# Patient Record
Sex: Female | Born: 1980 | Race: White | Hispanic: No | Smoking: Never smoker
Health system: Southern US, Community
[De-identification: ages and names within clinical notes are randomized; demographics above are authoritative.]

---

## 2010-02-03 ENCOUNTER — Ambulatory Visit (HOSPITAL_COMMUNITY): Admission: RE | Admit: 2010-02-03 | Discharge: 2010-02-03 | Payer: Self-pay | Admitting: Gynecology

## 2011-07-05 ENCOUNTER — Encounter: Payer: Self-pay | Admitting: *Deleted

## 2011-07-19 IMAGING — RF DG HYSTEROGRAM
6 series · 6 of 6 positions shown · non-contrast
Comparison: none

CLINICAL DATA: Infertility

HYSTEROSALPINGOGRAM
TECHNIQUE: Hysterosalpingogram was performed by the ordering
physician under fluoroscopy.  Fluoroscopic images are submitted for
interpretation following the procedure. Six images are submitted
for interpretation.

[Series 1: run · 1 of 1 slices shown (1 of 6)]
[im 1/1]
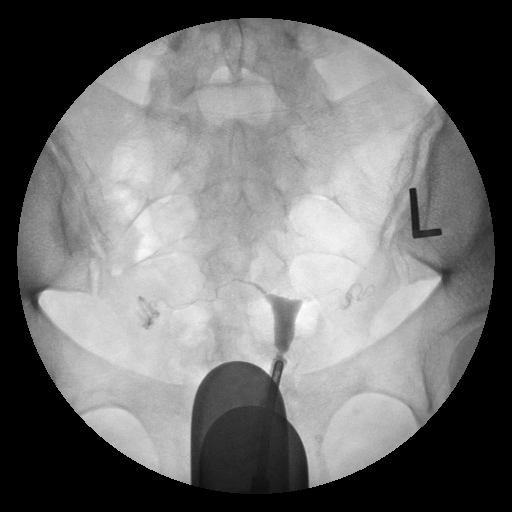

[Series 2: run · 1 of 1 slices shown (2 of 6)]
[im 1/1]
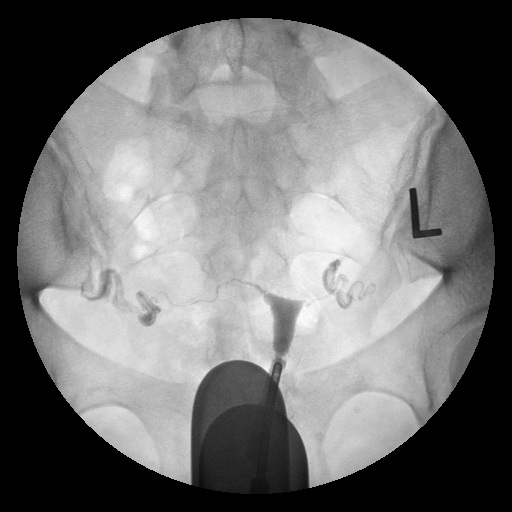

[Series 3: run · 1 of 1 slices shown (3 of 6)]
[im 1/1]
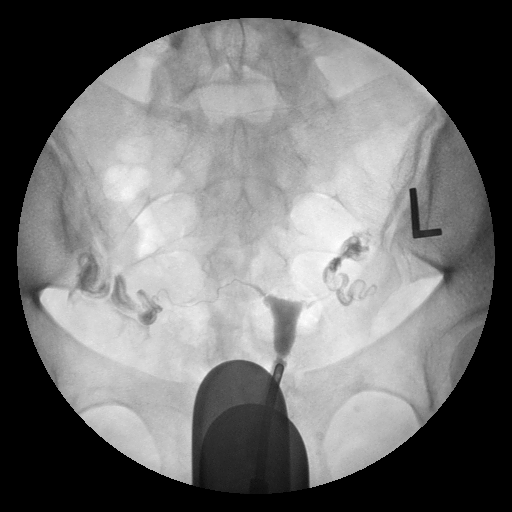

[Series 4: run · 1 of 1 slices shown (4 of 6)]
[im 1/1]
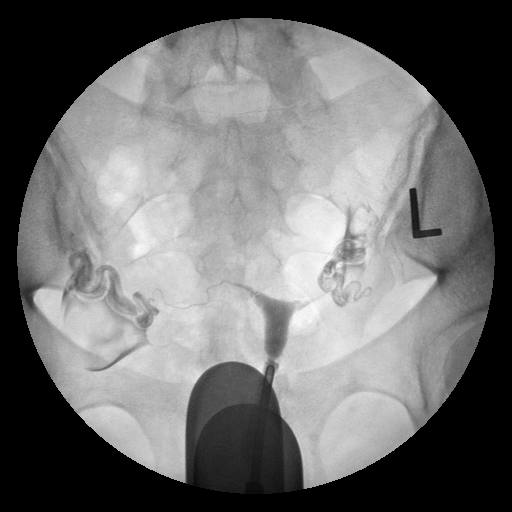

[Series 5: run · 1 of 1 slices shown (5 of 6)]
[im 1/1]
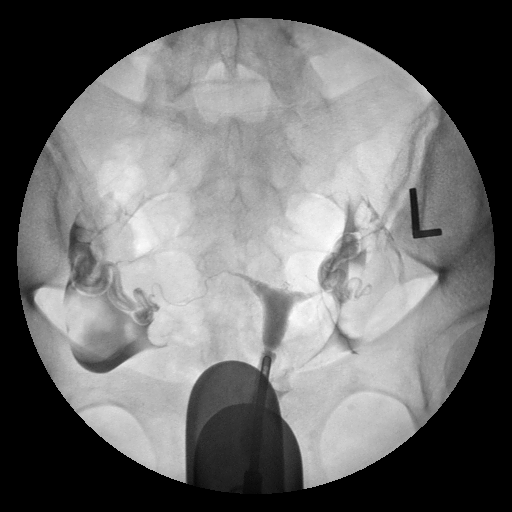

[Series 6: run · 1 of 1 slices shown (6 of 6)]
[im 1/1]
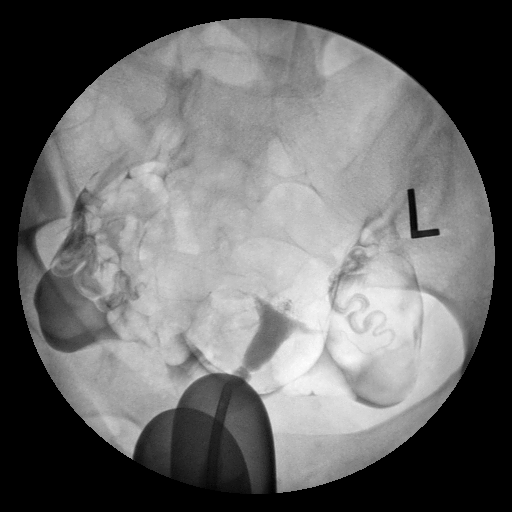

[6 of 6 positions shown; findings below may reference images not displayed]

FINDINGS: There are no filling defects within the uterine cavity.
The uterine contour is normal.  Both fallopian tubes fill and have
a normal appearance.  There is free spill of contrast into both
adnexal regions.
IMPRESSION: Normal HSG.

## 2011-07-21 ENCOUNTER — Other Ambulatory Visit: Payer: Self-pay

## 2011-07-31 ENCOUNTER — Encounter: Payer: Self-pay | Admitting: *Deleted

## 2011-08-07 ENCOUNTER — Other Ambulatory Visit (HOSPITAL_COMMUNITY): Payer: Self-pay | Admitting: *Deleted

## 2011-08-07 DIAGNOSIS — Z0489 Encounter for examination and observation for other specified reasons: Secondary | ICD-10-CM

## 2011-08-07 DIAGNOSIS — IMO0002 Reserved for concepts with insufficient information to code with codable children: Secondary | ICD-10-CM

## 2011-08-07 DIAGNOSIS — Z3682 Encounter for antenatal screening for nuchal translucency: Secondary | ICD-10-CM

## 2011-08-15 ENCOUNTER — Ambulatory Visit (HOSPITAL_COMMUNITY)
Admission: RE | Admit: 2011-08-15 | Discharge: 2011-08-15 | Disposition: A | Discharge status: Home / Self Care | Payer: BC Managed Care – PPO | Source: Ambulatory Visit | Attending: *Deleted | Admitting: *Deleted

## 2011-08-15 ENCOUNTER — Other Ambulatory Visit: Payer: Self-pay | Admitting: *Deleted

## 2011-08-15 ENCOUNTER — Encounter (HOSPITAL_COMMUNITY): Payer: Self-pay

## 2011-08-15 DIAGNOSIS — Z3682 Encounter for antenatal screening for nuchal translucency: Secondary | ICD-10-CM

## 2011-08-15 DIAGNOSIS — Z36 Encounter for antenatal screening of mother: Secondary | ICD-10-CM | POA: Insufficient documentation

## 2011-08-18 LAB — US OB COMP LESS 14 WKS

## 2011-09-26 ENCOUNTER — Other Ambulatory Visit (HOSPITAL_COMMUNITY): Payer: Self-pay

## 2011-09-27 ENCOUNTER — Other Ambulatory Visit (HOSPITAL_COMMUNITY): Payer: Self-pay

## 2014-05-18 ENCOUNTER — Encounter (HOSPITAL_COMMUNITY): Payer: Self-pay

## 2019-10-16 ENCOUNTER — Encounter: Payer: Self-pay | Admitting: Neurology

## 2019-11-21 ENCOUNTER — Encounter: Payer: Self-pay | Admitting: Neurology

## 2019-11-21 ENCOUNTER — Other Ambulatory Visit: Payer: Self-pay

## 2019-11-21 ENCOUNTER — Ambulatory Visit: Payer: BLUE CROSS/BLUE SHIELD | Admitting: Neurology

## 2019-11-21 VITALS — BP 111/74 | HR 76 | Ht 65.0 in | Wt 180.6 lb

## 2019-11-21 DIAGNOSIS — G43109 Migraine with aura, not intractable, without status migrainosus: Secondary | ICD-10-CM | POA: Diagnosis not present

## 2019-11-21 MED ORDER — ONDANSETRON 4 MG PO TBDP: 4.0000 mg | ORAL_TABLET | Freq: Three times a day (TID) | ORAL | 3 refills | Status: DC | PRN

## 2019-11-21 MED ORDER — TOPIRAMATE 25 MG PO TABS: 25.0000 mg | ORAL_TABLET | Freq: Every day | ORAL | 3 refills | Status: DC

## 2019-11-21 NOTE — Progress Notes (Signed)
NEUROLOGY CONSULTATION NOTE  Yvonne Davila MRN: [id number] DOB: 09/07/1980  Referring provider: [name], MD Primary care provider: [name], MD  Reason for consult:  migraines  HISTORY OF PRESENT ILLNESS: Yvonne Davila is a 39 year old right-handed white female who presents for migraines.  History supplemented by referring provider note.  Onset:  Since her 52s Location:  Usually bifrontal/retro-orbital Quality:  throbbing Intensity:  Moderate.  She denies new headache, thunderclap headache  Aura:  Preceded by kaleidoscope vision followed by black out of vision for 5 to 20 minutes Premonitory Phase:  none Postdrome:  Groggy or dull residual ache the next day Associated symptoms:  Nausea, vomiting, photophobia, phonophobia.  She denies associated unilateral numbness or weakness. Duration:  4 to 10 hours Frequency:  1 to 2 a week Frequency of abortive medication: 1 to 2 days a week at most Triggers:  unknown Relieving factors:  none Activity:  aggravates  CT head without contrast performed on 10/10/2019 showed "No acute intracranial findings.  Mild global atrophy which appears advanced for the patient's age."  Recent eye exam was okay.    Current NSAIDS:  Contraindicated (EOE, ulcer) Current analgesics:  Tylenol, Fioricet (not helpful) Current triptans:  none Current ergotamine:  none Current anti-emetic:  none Current muscle relaxants:  none Current anti-anxiolytic:  none Current sleep aide:  none Current Antihypertensive medications:  spironolactone Current Antidepressant medications:  Venlafaxine XR 75mg  Current Anticonvulsant medications:  none Current anti-CGRP:  none Current Vitamins/Herbal/Supplements:  D3 Current Antihistamines/Decongestants:  none Other therapy:  rest Hormone/birth control:  Mirena  Past NSAIDS:  ibuprofen Past analgesics:  Excedrin Past abortive triptans:  Sumatriptan tablet (makes her feel sick) Past  abortive ergotamine:  none Past muscle relaxants:  none Past anti-emetic:  Zofran 4mg  Past antihypertensive medications:  Propranolol (caused drowsiness) Past antidepressant medications:  none Past anticonvulsant medications:  none Past anti-CGRP:  none Past vitamins/Herbal/Supplements:  none Past antihistamines/decongestants:  dramamine Other past therapies:  none  Caffeine:   1 cup coffee in morning Diet:  At least 60 oz water daily.  Sometimes soda.  Occasionally skips meals Exercise:  Not routine Depression:  no; Anxiety:  Some, not significant Other pain:  no Sleep:  6 to 7 hours.  Has OSA but not always able to tolerate CPAP.   Family history of headache:  Twin sister (migraine); mom (migraine)   PAST MEDICAL HISTORY: History reviewed. No pertinent past medical history.  PAST SURGICAL HISTORY: History reviewed. No pertinent surgical history.  MEDICATIONS: Current Outpatient Medications on File Prior to Visit  Medication Sig Dispense Refill  . METFORMIN HCL PO Take by mouth.    PRENATAL VITAMINS PO Take by mouth.     No current facility-administered medications on file prior to visit.    ALLERGIES: Allergies  Allergen Reactions  . Amoxicillin-Pot Clavulanate     FAMILY HISTORY: No family history on file.  SOCIAL HISTORY: Social History   Socioeconomic History  . Marital status: Married    Spouse name: Not on file  . Number of children: Not on file  . Years of education: Not on file  . Highest education level: Not on file  Occupational History  . Not on file  Tobacco Use  . Smoking status: Not on file  Substance and Sexual Activity  . Alcohol use: Not on file  . Drug use: Not on file  . Sexual activity: Not on file  Other Topics Concern  . Not on file  Social  History Narrative  . Not on file   Social Determinants of Health   Financial Resource Strain:   . Difficulty of Paying Living Expenses:   Food Insecurity:   . Worried About Patent examiner in the Last Year:   . Barista in the Last Year:   Transportation Needs:   . Freight forwarder (Medical):   Marland Kitchen Lack of Transportation (Non-Medical):   Physical Activity:   . Days of Exercise per Week:   . Minutes of Exercise per Session:   Stress:   . Feeling of Stress :   Social Connections:   . Frequency of Communication with Friends and Family:   . Frequency of Social Gatherings with Friends and Family:   . Attends Religious Services:   . Active Member of Clubs or Organizations:   . Attends Banker Meetings:   Marland Kitchen Marital Status:   Intimate Partner Violence:   . Fear of Current or Ex-Partner:   . Emotionally Abused:   Marland Kitchen Physically Abused:   . Sexually Abused:     REVIEW OF SYSTEMS: Constitutional: No fevers, chills, or sweats, no generalized fatigue, change in appetite Eyes: No visual changes, double vision, eye pain Ear, nose and throat: No hearing loss, ear pain, nasal congestion, sore throat Cardiovascular: No chest pain, palpitations Respiratory:  No shortness of breath at rest or with exertion, wheezes GastrointestinaI: No nausea, vomiting, diarrhea, abdominal pain, fecal incontinence Genitourinary:  No dysuria, urinary retention or frequency Musculoskeletal:  No neck pain, back pain Integumentary: No rash, pruritus, skin lesions Neurological: as above Psychiatric: No depression, insomnia, anxiety Endocrine: No palpitations, fatigue, diaphoresis, mood swings, change in appetite, change in weight, increased thirst Hematologic/Lymphatic:  No purpura, petechiae. Allergic/Immunologic: no itchy/runny eyes, nasal congestion, recent allergic reactions, rashes  PHYSICAL EXAM: Blood pressure 111/74, pulse 76, height 5\' 5"  (1.651 m), weight 180 lb 9.6 oz (81.9 kg), SpO2 99 %. General: No acute distress.  Patient appears well-groomed.  Head:  Normocephalic/atraumatic Eyes:  fundi examined but not visualized Neck: supple, no paraspinal  tenderness, full range of motion Back: No paraspinal tenderness Heart: regular rate and rhythm Lungs: Clear to auscultation bilaterally. Vascular: No carotid bruits. Neurological Exam: Mental status: alert and oriented to person, place, and time, recent and remote memory intact, fund of knowledge intact, attention and concentration intact, speech fluent and not dysarthric, language intact. Cranial nerves: CN I: not tested CN II: pupils equal, round and reactive to light, visual fields intact CN III, IV, VI:  full range of motion, no nystagmus, no ptosis CN V: facial sensation intact CN VII: upper and lower face symmetric CN VIII: hearing intact CN IX, X: gag intact, uvula midline CN XI: sternocleidomastoid and trapezius muscles intact CN XII: tongue midline Bulk & Tone: normal, no fasciculations. Motor:  5/5 throughout  Sensation:  Pinprick and vibration sensation intact. Deep Tendon Reflexes:  2+ throughout, toes downgoing.  Finger to nose testing:  Without dysmetria.  Heel to shin:  Without dysmetria.  Gait:  Normal station and stride.  Able to turn.  Romberg negative.  IMPRESSION: Migraine with aura, without status migrainosus, not intractable  PLAN: 1.  For preventative management, start topiramate 25mg  at bedtime.  We can increase to 50mg  at bedtime in 4 weeks if needed. 2.  For abortive therapy, will try Tosymra due to her significant associated nausea (better absorption).  Provided samples and if effective will send in prescription.  Zofran ODT 4mg  for nausea PRN 3.  Limit use of pain relievers to no more than 2 days out of week to prevent risk of rebound or medication-overuse headache. 4.  Keep headache diary 5.  Exercise, hydration, caffeine cessation, sleep hygiene, monitor for and avoid triggers 6. Follow up 4 months.   Thank you for allowing me to take part in the care of this patient.  Metta Clines, DO  CC: Cathie Olden, MD

## 2019-11-21 NOTE — Patient Instructions (Signed)
1.  For preventative management, start topiramate 25mg  at bedtime.  If headaches not improved in 4 weeks, contact me and we can increase dose 2.  For rescue therapy, try Tosymra:  1 spray in nostril.  May repeat in one hour if needed (maximum 2 sprays in 24 hours).  If effective, contact me and I will send in a prescription. 3. Ondansetron 4mg  dissolvable tablet for nausea 4.  Limit use of pain relievers to no more than 2 days out of week to prevent risk of rebound or medication-overuse headache. 5.  Keep headache diary 6.  Exercise, hydration, caffeine cessation, sleep hygiene, monitor for and avoid triggers 7.  Follow up 4 months   Migraine Headache A migraine headache is a very strong throbbing pain on one side or both sides of your head. This type of headache can also cause other symptoms. It can last from 4 hours to 3 days. Talk with your doctor about what things may bring on (trigger) this condition. What are the causes? The exact cause of this condition is not known. This condition may be triggered or caused by:  Drinking alcohol.  Smoking.  Taking medicines, such as: ? Medicine used to treat chest pain (nitroglycerin). ? Birth control pills. ? Estrogen. ? Some blood pressure medicines.  Eating or drinking certain products.  Doing physical activity. Other things that may trigger a migraine headache include:  Having a menstrual period.  Pregnancy.  Hunger.  Stress.  Not getting enough sleep or getting too much sleep.  Weather changes.  Tiredness (fatigue). What increases the risk?  Being 6-32 years old.  Being female.  Having a family history of migraine headaches.  Being Caucasian.  Having depression or anxiety.  Being very overweight. What are the signs or symptoms?  A throbbing pain. This pain may: ? Happen in any area of the head, such as on one side or both sides. ? Make it hard to do daily activities. ? Get worse with physical activity. ? Get  worse around bright lights or loud noises.  Other symptoms may include: ? Feeling sick to your stomach (nauseous). ? Vomiting. ? Dizziness. ? Being sensitive to bright lights, loud noises, or smells.  Before you get a migraine headache, you may get warning signs (an aura). An aura may include: ? Seeing flashing lights or having blind spots. ? Seeing bright spots, halos, or zigzag lines. ? Having tunnel vision or blurred vision. ? Having numbness or a tingling feeling. ? Having trouble talking. ? Having weak muscles.  Some people have symptoms after a migraine headache (postdromal phase), such as: ? Tiredness. ? Trouble thinking (concentrating). How is this treated?  Taking medicines that: ? Relieve pain. ? Relieve the feeling of being sick to your stomach. ? Prevent migraine headaches.  Treatment may also include: ? Having acupuncture. ? Avoiding foods that bring on migraine headaches. ? Learning ways to control your body functions (biofeedback). ? Therapy to help you know and deal with negative thoughts (cognitive behavioral therapy). Follow these instructions at home: Medicines  Take over-the-counter and prescription medicines only as told by your doctor.  Ask your doctor if the medicine prescribed to you: ? Requires you to avoid driving or using heavy machinery. ? Can cause trouble pooping (constipation). You may need to take these steps to prevent or treat trouble pooping:  Drink enough fluid to keep your pee (urine) pale yellow.  Take over-the-counter or prescription medicines.  Eat foods that are high in fiber. These  include beans, whole grains, and fresh fruits and vegetables.  Limit foods that are high in fat and sugar. These include fried or sweet foods. Lifestyle  Do not drink alcohol.  Do not use any products that contain nicotine or tobacco, such as cigarettes, e-cigarettes, and chewing tobacco. If you need help quitting, ask your doctor.  Get at least  8 hours of sleep every night.  Limit and deal with stress. General instructions      Keep a journal to find out what may bring on your migraine headaches. For example, write down: ? What you eat and drink. ? How much sleep you get. ? Any change in what you eat or drink. ? Any change in your medicines.  If you have a migraine headache: ? Avoid things that make your symptoms worse, such as bright lights. ? It may help to lie down in a dark, quiet room. ? Do not drive or use heavy machinery. ? Ask your doctor what activities are safe for you.  Keep all follow-up visits as told by your doctor. This is important. Contact a doctor if:  You get a migraine headache that is different or worse than others you have had.  You have more than 15 headache days in one month. Get help right away if:  Your migraine headache gets very bad.  Your migraine headache lasts longer than 72 hours.  You have a fever.  You have a stiff neck.  You have trouble seeing.  Your muscles feel weak or like you cannot control them.  You start to lose your balance a lot.  You start to have trouble walking.  You pass out (faint).  You have a seizure. Summary  A migraine headache is a very strong throbbing pain on one side or both sides of your head. These headaches can also cause other symptoms.  This condition may be treated with medicines and changes to your lifestyle.  Keep a journal to find out what may bring on your migraine headaches.  Contact a doctor if you get a migraine headache that is different or worse than others you have had.  Contact your doctor if you have more than 15 headache days in a month. This information is not intended to replace advice given to you by your health care provider. Make sure you discuss any questions you have with your health care provider. Document Revised: 10/25/2018 Document Reviewed: 08/15/2018 Elsevier Patient Education  2020 [employer].

## 2019-12-23 ENCOUNTER — Telehealth: Payer: Self-pay | Admitting: Neurology

## 2019-12-23 NOTE — Telephone Encounter (Signed)
[facility] message:  "Caller states she is requesting the dosage for her migraine meds be increased"

## 2019-12-23 NOTE — Telephone Encounter (Signed)
LMOVM 8:34am: What medication are we asking for. Topirmate or [employer]

## 2019-12-24 NOTE — Telephone Encounter (Signed)
Pt returned our call, She was told at her last visit if she needed to we would increase the dosage of Topiramate to 50mg .  Advised pt that I do see that in her note and let Dr.Jaffe know she feels that she needs the increase.

## 2019-12-25 ENCOUNTER — Other Ambulatory Visit: Payer: Self-pay

## 2019-12-25 MED ORDER — TOPIRAMATE 50 MG PO TABS: 50.0000 mg | ORAL_TABLET | Freq: Every day | ORAL | 0 refills | Status: DC

## 2019-12-25 NOTE — Telephone Encounter (Signed)
Topiramate 50 mg at bedtime sent to Pt pharmacy. Pt can call back in 4 weeks if she desires to increase.

## 2019-12-25 NOTE — Telephone Encounter (Signed)
Ok to increase topiramate to 50mg  at bedtime.

## 2020-01-22 ENCOUNTER — Other Ambulatory Visit: Payer: Self-pay | Admitting: Neurology

## 2020-04-01 NOTE — Progress Notes (Signed)
NEUROLOGY FOLLOW UP OFFICE NOTE  NARIYA Davila [id number]  HISTORY OF PRESENT ILLNESS: Yvonne Davila. Kaatz is a 39 year old right-handed white female who presents for migraines.  History supplemented by referring provider note.  UPDATE: Started topiramate in May, which was subsequently increased to 50mg  in June.  Intensity:  moderate Duration:  2 to 4 hours Frequency:  15 headache days in last 30 days, however none of them were "migraine-level" Frequency of abortive medication: takes either Tylenol or Motrin 15 days a month. Current NSAIDS:  Contraindicated but may need to take Motrin (EOE, ulcer) Current analgesics:  Tylenol Current triptans:  Tosymra NS (tried one sample but didn't think she took it quick enough). Current ergotamine:  none Current anti-emetic:  Zofran ODT Current muscle relaxants:  none Current anti-anxiolytic:  none Current sleep aide:  none Current Antihypertensive medications:  spironolactone Current Antidepressant medications:  Venlafaxine XR 75mg  Current Anticonvulsant medications:  topiramate 50mg  Current anti-CGRP:  none Current Vitamins/Herbal/Supplements:  D3 Current Antihistamines/Decongestants:  none Other therapy:  rest Hormone/birth control:  Mirena  Caffeine:   1 cup coffee in morning Diet:  At least 60 oz water daily.  Stopped soda.  Occasionally skips meals Exercise:  Not routine Depression:  no; Anxiety:  Some, not significant Other pain:  no Sleep:  6 to 7 hours.  Has OSA but not always able to tolerate CPAP.   HISTORY: Onset:  Since her 85s Location:  Usually bifrontal/retro-orbital Quality:  throbbing.  Less severe headaches are pressure-like Initial Intensity:  Moderate.  She denies new headache, thunderclap headache  Aura:  Preceded by kaleidoscope vision followed by black out of vision for 5 to 20 minutes Premonitory Phase:  none Postdrome:  Groggy or dull residual ache the next day Associated symptoms:  Nausea, vomiting,  photophobia, phonophobia.  She denies associated unilateral numbness or weakness. Initial Duration:  4 to 10 hours Initial Frequency:  1 to 2 a week Initial Frequency of abortive medication: 1 to 2 days a week at most Triggers:  unknown Relieving factors:  none Activity:  aggravates  CT head without contrast performed on 10/10/2019 showed "No acute intracranial findings.  Mild global atrophy which appears advanced for the patient's age."  Eye exam was okay.    Past NSAIDS:  ibuprofen Past analgesics:  Excedrin, Tylenol, Fioricet with Codeine Past abortive triptans:  Sumatriptan tablet (makes her feel sick) Past abortive ergotamine:  none Past muscle relaxants:  none Past anti-emetic:  Zofran 4mg  Past antihypertensive medications:  Propranolol (caused drowsiness) Past antidepressant medications:  none Past anticonvulsant medications:  none Past anti-CGRP:  none Past vitamins/Herbal/Supplements:  none Past antihistamines/decongestants:  dramamine Other past therapies:  none    Family history of headache:  Twin sister (migraine); mom (migraine)  PAST MEDICAL HISTORY: No past medical history on file.  MEDICATIONS: Current Outpatient Medications on File Prior to Visit  Medication Sig Dispense Refill  . acyclovir (ZOVIRAX) 400 MG tablet Take 400 mg by mouth 2 (two) times daily.    . butalbital-acetaminophen-caffeine (FIORICET WITH CODEINE) 50-325-40-30 MG capsule Take 1 capsule by mouth every 6 (six) hours as needed for headache.    . METFORMIN HCL PO Take by mouth.    . ondansetron (ZOFRAN ODT) 4 MG disintegrating tablet Take 1 tablet (4 mg total) by mouth every 8 (eight) hours as needed for nausea or vomiting. 20 tablet 3  . pantoprazole (PROTONIX) 40 MG tablet Take 40 mg by mouth daily.    PRENATAL VITAMINS PO  Take by mouth.    . spironolactone (ALDACTONE) 50 MG tablet Take 50 mg by mouth daily.    Marland Kitchen topiramate (TOPAMAX) 50 MG tablet TAKE 1 TABLET(50 MG) BY MOUTH AT BEDTIME  30 tablet 2  . venlafaxine XR (EFFEXOR-XR) 75 MG 24 hr capsule Take 75 mg by mouth 1 day or 1 dose.     No current facility-administered medications on file prior to visit.    ALLERGIES: Allergies  Allergen Reactions  . Amoxicillin-Pot Clavulanate     FAMILY HISTORY: History reviewed. No pertinent family history.  SOCIAL HISTORY: Social History   Socioeconomic History  . Marital status: Married    Spouse name: Not on file  . Number of children: Not on file  . Years of education: Not on file  . Highest education level: Not on file  Occupational History  . Not on file  Tobacco Use  . Smoking status: Never Smoker  . Smokeless tobacco: Never Used  Substance and Sexual Activity  . Alcohol use: Yes    Comment: Occassional  . Drug use: Not on file  . Sexual activity: Not on file  Other Topics Concern  . Not on file  Social History Narrative   Right handed   Lives husband and daughter two story   Social Determinants of Health   Financial Resource Strain:   . Difficulty of Paying Living Expenses: Not on file  Food Insecurity:   . Worried About Programme researcher, broadcasting/film/video in the Last Year: Not on file  . Ran Out of Food in the Last Year: Not on file  Transportation Needs:   . Lack of Transportation (Medical): Not on file  . Lack of Transportation (Non-Medical): Not on file  Physical Activity:   . Days of Exercise per Week: Not on file  . Minutes of Exercise per Session: Not on file  Stress:   . Feeling of Stress : Not on file  Social Connections:   . Frequency of Communication with Friends and Family: Not on file  . Frequency of Social Gatherings with Friends and Family: Not on file  . Attends Religious Services: Not on file  . Active Member of Clubs or Organizations: Not on file  . Attends Banker Meetings: Not on file  . Marital Status: Not on file  Intimate Partner Violence:   . Fear of Current or Ex-Partner: Not on file  . Emotionally Abused: Not on file   . Physically Abused: Not on file  . Sexually Abused: Not on file    PHYSICAL EXAM: Blood pressure (!) 126/91, pulse 83, height 5\' 5"  (1.651 m), weight 181 lb 6.4 oz (82.3 kg), SpO2 100 %. General: No acute distress.  Patient appears well-groomed.   Head:  Normocephalic/atraumatic Eyes:  Fundi examined but not visualized Neck: supple, no paraspinal tenderness, full range of motion Heart:  Regular rate and rhythm Lungs:  Clear to auscultation bilaterally Back: No paraspinal tenderness Neurological Exam: alert and oriented to person, place, and time. Attention span and concentration intact, recent and remote memory intact, fund of knowledge intact.  Speech fluent and not dysarthric, language intact.  CN II-XII intact. Bulk and tone normal, muscle strength 5/5 throughout.  Sensation to light touch, temperature and vibration intact.  Deep tendon reflexes 2+ throughout, toes downgoing.  Finger to nose and heel to shin testing intact.  Gait normal, Romberg negative.  IMPRESSION: Migraine with aura, without status migrainosus, not intractable  PLAN: 1.  For preventative management, will increase topiramate  to 100mg  at bedtime. If headaches not improved in 6 weeks, we will either add or switch to Aimovig. 2.  For abortive therapy, she will again try Tosymra, taking at earliest onset and repeating dose in at least one hour if needed. 3.  Limit use of pain relievers to no more than 2 days out of week to prevent risk of rebound or medication-overuse headache. 4.  Keep headache diary 5.  Exercise, hydration, caffeine cessation, sleep hygiene, monitor for and avoid triggers 6. Follow up 6 months.   , DO  CC: Shon Millet, MD

## 2020-04-05 ENCOUNTER — Ambulatory Visit (INDEPENDENT_AMBULATORY_CARE_PROVIDER_SITE_OTHER): Payer: BC Managed Care – PPO | Admitting: Neurology

## 2020-04-05 ENCOUNTER — Other Ambulatory Visit: Payer: Self-pay

## 2020-04-05 ENCOUNTER — Encounter: Payer: Self-pay | Admitting: Neurology

## 2020-04-05 VITALS — BP 126/91 | HR 83 | Ht 65.0 in | Wt 181.4 lb

## 2020-04-05 DIAGNOSIS — G43109 Migraine with aura, not intractable, without status migrainosus: Secondary | ICD-10-CM | POA: Diagnosis not present

## 2020-04-05 MED ORDER — TOPIRAMATE 100 MG PO TABS: 100.0000 mg | ORAL_TABLET | Freq: Every day | ORAL | 5 refills | Status: DC

## 2020-04-05 NOTE — Patient Instructions (Addendum)
°  1. Increase topiramate to 100mg  at bedtime.  Contact in 6 weeks with update 2. Take Tosymra NS at earliest onset of headache.  May repeat dose once in 1 hour if needed.  Maximum 2 sprays in 24 hours.  IF effective, contact me for prescription. 3. Limit use of pain relievers to no more than 2 days out of the week.  These medications include acetaminophen, NSAIDs (ibuprofen/Advil/Motrin, naproxen/Aleve, triptans (Imitrex/sumatriptan), Excedrin, and narcotics.  This will help reduce risk of rebound headaches. 4. Be aware of common food triggers:  - Caffeine:  coffee, black tea, cola, Mt. Dew  - Chocolate  - Dairy:  aged cheeses (brie, blue, cheddar, gouda, [place], provolone, [place], Swiss, etc), chocolate milk, buttermilk, sour cream, limit eggs and yogurt  - Nuts, peanut butter  - Alcohol  - Cereals/grains:  FRESH breads (fresh bagels, sourdough, doughnuts), yeast productions  - Processed/canned/aged/cured meats (pre-packaged deli meats, hotdogs)  - MSG/glutamate:  soy sauce, flavor enhancer, pickled/preserved/marinated foods  - Sweeteners:  aspartame (Equal, Nutrasweet).  Sugar and Splenda are okay  - Vegetables:  legumes (lima beans, lentils, snow peas, fava beans, pinto peans, peas, garbanzo beans), sauerkraut, onions, olives, pickles  - Fruit:  avocados, bananas, citrus fruit (orange, lemon, grapefruit), mango  - Other:  Frozen meals, macaroni and cheese 5. Routine exercise 6. Stay adequately hydrated (aim for 64 oz water daily) 7. Keep headache diary 8. Maintain proper stress management 9. Maintain proper sleep hygiene 10. Do not skip meals 11. Consider supplements:  magnesium citrate 400mg  daily, riboflavin 400mg  daily, coenzyme Q10 100mg  three times daily.

## 2020-04-13 ENCOUNTER — Other Ambulatory Visit: Payer: Self-pay | Admitting: Neurology

## 2020-04-13 MED ORDER — AIMOVIG 70 MG/ML ~~LOC~~ SOAJ: 70.0000 mg | SUBCUTANEOUS | 5 refills | Status: DC

## 2020-04-13 NOTE — Telephone Encounter (Signed)
Options include:  Giving the topiramate another week to see if her body adapts to the increased dose.  Otherwise, we can taper off of topiramate and start an alternative medication.  We can try Aimovig, a monthly self injection (auto-injector) that has been very effective in migraine prevention and typically well-tolerated (like all injections, monitor for injection site reaction).  Alternatively, she can try Nurtec a dissolvable tablet that is taken once every other day (in the same family as the injection).

## 2020-04-14 ENCOUNTER — Telehealth: Payer: Self-pay | Admitting: Neurology

## 2020-04-14 ENCOUNTER — Encounter: Payer: Self-pay | Admitting: Neurology

## 2020-04-14 NOTE — Telephone Encounter (Signed)
Can you check on the status of this, thanks

## 2020-04-14 NOTE — Telephone Encounter (Signed)
Patient called and said her insurance company is requiring a prior authorization for Aimovig 70 MG.  [facility] [address] in [place], [place]

## 2020-04-14 NOTE — Progress Notes (Addendum)
Harless Litten (Key: [id number]) Aimovig 70MG /ML auto-injectors   Form PA Form [phone] NCPDP) Created 7 minutes ago Sent to Plan 5 minutes ago Plan Response 5 minutes ago Submit Clinical Questions 1 minute ago Determination Favorable 1 minute ago Message from Plan PA Case: ([id number], Status: Approved, Coverage Starts on: 04/14/2020 12:00:00 AM, Coverage Ends on: 07/13/2020 12:00:00 AM.

## 2020-04-14 NOTE — Telephone Encounter (Signed)
Approval on file for the aimovig valid until 07/13/20. Sent to her pharm so they could re-run medication.

## 2020-07-02 ENCOUNTER — Encounter: Payer: Self-pay | Admitting: Neurology

## 2020-07-02 NOTE — Progress Notes (Signed)
Yvonne Davila (Key: [id number]) Aimovig 70MG /ML auto-injectors   Form PA Form [id number] NCPDP) Created 17 hours ago Sent to Plan 17 hours ago Plan Response 17 hours ago Submit Clinical Questions 18 minutes ago Determination Favorable 17 minutes ago Message from Plan PA Case: ([id number], Status: Approved, Coverage Starts on: 07/02/2020 12:00:00 AM, Coverage Ends on: 07/02/2021 12:00:00 AM.

## 2020-07-14 ENCOUNTER — Telehealth: Payer: Self-pay

## 2020-07-14 NOTE — Telephone Encounter (Signed)
Pt called and informed that Results should first be discussed by the ordering provider.  She should make appointment with me for further review (bring CD and report) pt has an appointment in April we will pt her on wait list if something open up sooner

## 2020-07-14 NOTE — Telephone Encounter (Signed)
Results should first be discussed by the ordering provider.  She should make appointment with me for further review (bring CD and report)

## 2020-07-18 NOTE — Progress Notes (Signed)
NEUROLOGY FOLLOW UP OFFICE NOTE  Yvonne Davila [id number]   Subjective:  Yvonne Davila is a 39 year oldright-handed white female who follows up for migraines.  She is accompanied by her husband.  UPDATE: Increased topiramate to 100mg  at bedtime but it caused increased fatigue and depression.  She was switched to Aimovig 70mg  in September.  Migraines have been better.   Intensity:  moderate Duration:  1 to 2 hours Frequency:  1 headache in December Usually doesn't take analgesics.  Usually needs to eat and they get better. No severe migraines in December  As a follow up of the CT from March, she had an MRI of the brain on 07/05/2020, which was personally reviewed and again demonstrated mild generalized atrophy.  This finding has been concerning for the patient and is wondering what should be done.  She is concerned about Alzheimer's because her maternal grandfather was diagnosed with AD in his 84s and her maternal aunt was diagnosed in her 43s.  Frequency of abortive medication: takes either Tylenol or Motrin 15 days a month. Current NSAIDS:Contraindicated but may need to take Motrin (EOE, ulcer) Current analgesics:Tylenol Current triptans:Tosymra NS  Current ergotamine:none Current anti-emetic:Zofran ODT Current muscle relaxants:none Current anti-anxiolytic:none Current sleep aide:none Current Antihypertensive medications:spironolactone Current Antidepressant medications:Venlafaxine XR 75mg  Current Anticonvulsant medications:none Current anti-CGRP:Aimovig 70mg  Current Vitamins/Herbal/Supplements:D3 Current Antihistamines/Decongestants:none Other therapy:rest Hormone/birth control:Mirena  Caffeine:1 cup coffee in morning Diet:At least 60 oz water daily. Stopped soda. Occasionally skips meals Exercise:Not routine Depression:no; Anxiety:Some, not significant Other pain:no Sleep: 6 to 7 hours. Has OSA but not always able to  tolerate CPAP.   HISTORY: Onset:Since her 21s Location:Usually bifrontal/retro-orbital Quality:throbbing.  Less severe headaches are pressure-like Initial Intensity:Moderate.Shedenies new headache, thunderclap headache  Aura:Preceded by kaleidoscope vision followed by black out of vision for5 to 71s Premonitory Phase:none Postdrome:Groggy or dull residual ache the next day Associated symptoms:Nausea, vomiting, photophobia, phonophobia.Shedenies associated unilateral numbness or weakness. Initial Duration:4 to 10 hours Initial Frequency:1 to 2 a week Initial Frequency of abortive medication:1 to 2 days a week at most Triggers:unknown Relieving factors:none Activity:aggravates  CT head without contrast performed on 10/10/2019 showed "No acute intracranial findings. Mild global atrophy which appears advanced for the patient's age."  Eye exam was okay.  Past NSAIDS:ibuprofen Past analgesics:Excedrin, Tylenol, Fioricet with Codeine Past abortive triptans:Sumatriptantablet(makes her feel sick) Past abortive ergotamine:none Past muscle relaxants:none Past anti-emetic:Zofran 4mg  Past antihypertensive medications:Propranolol (caused drowsiness) Past antidepressant medications:none Past anticonvulsant medications:topiramate 100mg  (depression, fatigue) Past anti-CGRP:none Past vitamins/Herbal/Supplements:none Past antihistamines/decongestants:dramamine Other past therapies:none   Family history of headache:Twin sister (migraine); mom (migraine)  PAST MEDICAL HISTORY: No past medical history on file.  MEDICATIONS: Current Outpatient Medications on File Prior to Visit  Medication Sig Dispense Refill  . acyclovir (ZOVIRAX) 400 MG tablet Take 400 mg by mouth 2 (two) times daily.    . butalbital-acetaminophen-caffeine (FIORICET WITH CODEINE) 50-325-40-30 MG capsule Take 1 capsule by mouth every 6 (six) hours  as needed for headache. (Patient not taking: Reported on 04/05/2020)    . Erenumab-aooe (AIMOVIG) 70 MG/ML SOAJ Inject 70 mg into the skin every 28 (twenty-eight) days. 1.12 mL 5  . METFORMIN HCL PO Take by mouth. (Patient not taking: Reported on 04/05/2020)    . ondansetron (ZOFRAN ODT) 4 MG disintegrating tablet Take 1 tablet (4 mg total) by mouth every 8 (eight) hours as needed for nausea or vomiting. 20 tablet 3  . pantoprazole (PROTONIX) 40 MG tablet Take 40 mg by mouth daily.     PRENATAL VITAMINS PO Take by mouth. (Patient not taking: Reported on 04/05/2020)    . spironolactone (ALDACTONE) 50 MG tablet Take 50 mg by mouth daily.    Marland Kitchen venlafaxine XR (EFFEXOR-XR) 75 MG 24 hr capsule Take 75 mg by mouth 1 day or 1 dose.     No current facility-administered medications on file prior to visit.    ALLERGIES: Allergies  Allergen Reactions  . Amoxicillin-Pot Clavulanate     FAMILY HISTORY: History reviewed. No pertinent family history.  SOCIAL HISTORY: Social History   Socioeconomic History  . Marital status: Married    Spouse name: Not on file  . Number of children: Not on file  . Years of education: Not on file  . Highest education level: Not on file  Occupational History  . Not on file  Tobacco Use  . Smoking status: Never Smoker  . Smokeless tobacco: Never Used  Substance and Sexual Activity  . Alcohol use: Yes    Comment: Occassional  . Drug use: Not on file  . Sexual activity: Not on file  Other Topics Concern  . Not on file  Social History Narrative   Right handed   Lives husband and daughter two story   Social Determinants of Health   Financial Resource Strain: Not on file  Food Insecurity: Not on file  Transportation Needs: Not on file  Physical Activity: Not on file  Stress: Not on file  Social Connections: Not on file  Intimate Partner Violence: Not on file     Objective:  Blood pressure 108/73, pulse 88, height 5\' 5"  (1.651 m), weight 189 lb 3.2 oz  (85.8 kg), SpO2 99 %. General: No acute distress.  Patient appears well-groomed.     Assessment/Plan:   1.  Migraine with aura, without status migrainosus, not intractable, improved 2.  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  It is mild.  1.  Migraine prevention:  Aimovig 70mg  every 28 days 2.  Migraine rescue:  Tosymra or OTC analgesic 3.  Limit use of pain relievers to no more than 2 days out of week to prevent risk of rebound or medication-overuse headache. 4.  Keep headache diary 5.  For overall brain health, recommended routine exercise, Mediterranean diet and proper sleep hygiene. 6.  Follow up 6 months.  , DO  CC: , MD

## 2020-07-19 ENCOUNTER — Encounter: Payer: Self-pay | Admitting: Neurology

## 2020-07-19 ENCOUNTER — Other Ambulatory Visit: Payer: Self-pay

## 2020-07-19 ENCOUNTER — Ambulatory Visit: Payer: BC Managed Care – PPO | Admitting: Neurology

## 2020-07-19 VITALS — BP 108/73 | HR 88 | Ht 65.0 in | Wt 189.2 lb

## 2020-07-19 DIAGNOSIS — G43109 Migraine with aura, not intractable, without status migrainosus: Secondary | ICD-10-CM | POA: Diagnosis not present

## 2020-07-19 DIAGNOSIS — G319 Degenerative disease of nervous system, unspecified: Secondary | ICD-10-CM

## 2020-07-19 NOTE — Patient Instructions (Signed)
I think the mild atrophy of your brain is just how your brain looks.  I don't think it is indicative of anything clinical at this point.  1.  Recommend routine exercise, proper sleep and Mediterranean diet (see below) 2.  Continue Aimovig 3.  Follow up in 6 months   Mediterranean Diet A Mediterranean diet refers to food and lifestyle choices that are based on the traditions of countries located on the [employer]. This way of eating has been shown to help prevent certain conditions and improve outcomes for people who have chronic diseases, like kidney disease and heart disease. What are tips for following this plan? Lifestyle  Cook and eat meals together with your family, when possible.  Drink enough fluid to keep your urine clear or pale yellow.  Be physically active every day. This includes: ? Aerobic exercise like running or swimming. ? Leisure activities like gardening, walking, or housework.  Get 7-8 hours of sleep each night.  If recommended by your health care provider, drink red wine in moderation. This means 1 glass a day for nonpregnant women and 2 glasses a day for men. A glass of wine equals 5 oz (150 mL). Reading food labels   Check the serving size of packaged foods. For foods such as rice and pasta, the serving size refers to the amount of cooked product, not dry.  Check the total fat in packaged foods. Avoid foods that have saturated fat or trans fats.  Check the ingredients list for added sugars, such as corn syrup. Shopping  At the grocery store, buy most of your food from the areas near the walls of the store. This includes: ? Fresh fruits and vegetables (produce). ? Grains, beans, nuts, and seeds. Some of these may be available in unpackaged forms or large amounts (in bulk). ? Fresh seafood. ? Poultry and eggs. ? Low-fat dairy products.  Buy whole ingredients instead of prepackaged foods.  Buy fresh fruits and vegetables in-season from local farmers  markets.  Buy frozen fruits and vegetables in resealable bags.  If you do not have access to quality fresh seafood, buy precooked frozen shrimp or canned fish, such as tuna, salmon, or sardines.  Buy small amounts of raw or cooked vegetables, salads, or olives from the deli or salad bar at your store.  Stock your pantry so you always have certain foods on hand, such as olive oil, canned tuna, canned tomatoes, rice, pasta, and beans. Cooking  Cook foods with extra-virgin olive oil instead of using butter or other vegetable oils.  Have meat as a side dish, and have vegetables or grains as your main dish. This means having meat in small portions or adding small amounts of meat to foods like pasta or stew.  Use beans or vegetables instead of meat in common dishes like chili or lasagna.  Experiment with different cooking methods. Try roasting or broiling vegetables instead of steaming or sauteing them.  Add frozen vegetables to soups, stews, pasta, or rice.  Add nuts or seeds for added healthy fat at each meal. You can add these to yogurt, salads, or vegetable dishes.  Marinate fish or vegetables using olive oil, lemon juice, garlic, and fresh herbs. Meal planning   Plan to eat 1 vegetarian meal one day each week. Try to work up to 2 vegetarian meals, if possible.  Eat seafood 2 or more times a week.  Have healthy snacks readily available, such as: ? Vegetable sticks with hummus. ? [place] yogurt. ?  Fruit and nut trail mix.  Eat balanced meals throughout the week. This includes: ? Fruit: 2-3 servings a day ? Vegetables: 4-5 servings a day ? Low-fat dairy: 2 servings a day ? Fish, poultry, or lean meat: 1 serving a day ? Beans and legumes: 2 or more servings a week ? Nuts and seeds: 1-2 servings a day ? Whole grains: 6-8 servings a day ? Extra-virgin olive oil: 3-4 servings a day  Limit red meat and sweets to only a few servings a month What are my food  choices?  Mediterranean diet ? Recommended  Grains: Whole-grain pasta. Brown rice. [place] wheat. Polenta. Couscous. Whole-wheat bread. [place].  Vegetables: Artichokes. Beets. Broccoli. Cabbage. Carrots. Eggplant. Green beans. Chard. Kale. Spinach. Onions. Leeks. Peas. Squash. Tomatoes. Peppers. Radishes.  Fruits: Apples. Apricots. Avocado. Berries. Bananas. Cherries. Dates. Figs. Grapes. Lemons. Melon. Oranges. Peaches. Plums. Pomegranate.  Meats and other protein foods: Beans. Almonds. Sunflower seeds. Pine nuts. Peanuts. [place]. Salmon. Scallops. Shrimp. [place]. Tilapia. Clams. Oysters. Eggs.  Dairy: Low-fat milk. Cheese. Greek yogurt.  Beverages: Water. Red wine. Herbal tea.  Fats and oils: Extra virgin olive oil. Avocado oil. Grape seed oil.  Sweets and desserts: [place] yogurt with honey. Baked apples. Poached pears. Trail mix.  Seasoning and other foods: Basil. Cilantro. Coriander. Cumin. Mint. Parsley. Sage. Rosemary. Tarragon. Garlic. Oregano. Thyme. Pepper. Balsalmic vinegar. Tahini. Hummus. Tomato sauce. Olives. Mushrooms. ? Limit these  Grains: Prepackaged pasta or rice dishes. Prepackaged cereal with added sugar.  Vegetables: Deep fried potatoes (french fries).  Fruits: Fruit canned in syrup.  Meats and other protein foods: Beef. Pork. Lamb. Poultry with skin. Hot dogs. [place] Salines.  Dairy: Ice cream. Sour cream. Whole milk.  Beverages: Juice. Sugar-sweetened soft drinks. Beer. Liquor and spirits.  Fats and oils: Butter. Canola oil. Vegetable oil. Beef fat (tallow). Lard.  Sweets and desserts: Cookies. Cakes. Pies. Candy.  Seasoning and other foods: Mayonnaise. [place] sauces and marinades. The items listed may not be a complete list. Talk with your dietitian about what dietary choices are right for you. Summary  The Mediterranean diet includes both food and lifestyle choices.  Eat a variety of fresh fruits and vegetables, beans, nuts, seeds, and whole  grains.  Limit the amount of red meat and sweets that you eat.  Talk with your health care provider about whether it is safe for you to drink red wine in moderation. This means 1 glass a day for nonpregnant women and 2 glasses a day for men. A glass of wine equals 5 oz (150 mL). This information is not intended to replace advice given to you by your health care provider. Make sure you discuss any questions you have with your health care provider. Document Revised: 03/02/2016 Document Reviewed: 02/24/2016 Elsevier Patient Education  [place].

## 2020-09-25 ENCOUNTER — Other Ambulatory Visit: Payer: Self-pay | Admitting: Neurology

## 2020-10-15 ENCOUNTER — Ambulatory Visit: Payer: BC Managed Care – PPO | Admitting: Neurology

## 2021-01-18 ENCOUNTER — Other Ambulatory Visit: Payer: Self-pay | Admitting: Neurology

## 2021-01-28 NOTE — Progress Notes (Signed)
NEUROLOGY FOLLOW UP OFFICE NOTE  Yvonne Davila [id number]  Assessment/Plan:   Migraine with aura, without status migrainosus, not intractable - worse likely due to stress  Migraine prevention:  Increase Aimovig to 140mg  Q28d Migraine rescue:  Will have her try Trudhesa nasal spray Limit use of pain relievers to no more than 2 days out of week to prevent risk of rebound or medication-overuse headache. Keep headache diary Follow up 6 months   Subjective:  . Kuznia is a 40 year old right-handed white female who follows up for migraines.  She is accompanied by her husband.   UPDATE: She is currently going through a divorce and has had increased stress.  She has had worsening migraines. Intensity:  moderate to severe Duration:  2 to 3 days Frequency:  2 severe migraines a month (total 8 to 10 headache days a month)  Current NSAIDS:  Ibuprofen.  Contraindicated but may need to take Motrin (EOE, ulcer) Current analgesics:  Tylenol Current triptans:  none Current ergotamine:  none Current anti-emetic:  Zofran ODT Current muscle relaxants:  none Current anti-anxiolytic:  none Current sleep aide:  none Current Antihypertensive medications:  spironolactone Current Antidepressant medications:  Venlafaxine XR 75mg  Current Anticonvulsant medications:  none Current anti-CGRP:  Aimovig 70mg  Current Vitamins/Herbal/Supplements:  D3 Current Antihistamines/Decongestants:  none Other therapy:  rest Hormone/birth control:  Mirena   Caffeine:   1 cup coffee in morning Diet:  At least 60 oz water daily.  Stopped soda.  Occasionally skips meals Exercise:  Not routine Depression:  no; Anxiety:  Some, not significant Other pain:  no Sleep:  6 to 7 hours.  Has OSA but not always able to tolerate CPAP.    HISTORY:  Onset:  Since her 56s Location:  Usually bifrontal/retro-orbital Quality:  throbbing.  Less severe headaches are pressure-like Initial Intensity:  Moderate.  She denies  new headache, thunderclap headache Aura:  Preceded by kaleidoscope vision followed by black out of vision for 5 to 20 minutes Premonitory Phase:  none Postdrome:  Groggy or dull residual ache the next day Associated symptoms:  Nausea, vomiting, photophobia, phonophobia.  She denies associated unilateral numbness or weakness. Initial Duration:  4 to 10 hours Initial Frequency:  1 to 2 a week Initial Frequency of abortive medication: 1 to 2 days a week at most Triggers:  unknown Relieving factors:  none Activity:  aggravates   CT head without contrast performed on 10/10/2019 showed "No acute intracranial findings.  Mild global atrophy which appears advanced for the patient's age."  As a follow up of the CT from March, she had an MRI of the brain on 07/05/2020, which was personally reviewed and again demonstrated mild generalized atrophy. This finding has been concerning for the patient and is wondering what should be done.  She is concerned about Alzheimer's because her maternal grandfather was diagnosed with AD in his 56s and her maternal aunt was diagnosed in her 95s.   Eye exam was okay.     Past NSAIDS:  ibuprofen Past analgesics:  Excedrin, Tylenol, Fioricet with Codeine Past abortive triptans:  Sumatriptan tablet (makes her feel sick), Tosymra NS Past abortive ergotamine:  none Past muscle relaxants:  none Past anti-emetic:  Zofran 4mg  Past antihypertensive medications:  Propranolol (caused drowsiness) Past antidepressant medications:  none Past anticonvulsant medications:  topiramate 100mg  (depression, fatigue) Past anti-CGRP:  07/07/2020 Past vitamins/Herbal/Supplements:  none Past antihistamines/decongestants:  dramamine Other past therapies:  none     Family history of headache:  Twin sister (migraine); mom (migraine)  PAST MEDICAL HISTORY: No past medical history on file.  MEDICATIONS: Current Outpatient Medications on File Prior to Visit  Medication Sig Dispense Refill    acyclovir (ZOVIRAX) 400 MG tablet Take 400 mg by mouth 2 (two) times daily.     AIMOVIG 70 MG/ML SOAJ ADMINISTER 1 ML UNDER THE SKIN EVERY 28 DAYS 1 mL 3   ondansetron (ZOFRAN ODT) 4 MG disintegrating tablet Take 1 tablet (4 mg total) by mouth every 8 (eight) hours as needed for nausea or vomiting. 20 tablet 3   pantoprazole (PROTONIX) 40 MG tablet Take 40 mg by mouth daily.     spironolactone (ALDACTONE) 50 MG tablet Take 50 mg by mouth daily.     venlafaxine XR (EFFEXOR-XR) 75 MG 24 hr capsule Take 75 mg by mouth 1 day or 1 dose.     No current facility-administered medications on file prior to visit.    ALLERGIES: Allergies  Allergen Reactions   Amoxicillin-Pot Clavulanate     FAMILY HISTORY: No family history on file.    Objective:  Blood pressure 112/66, pulse 89, height 5\' 5"  (1.651 m), weight 191 lb (86.6 kg), SpO2 100 %, unknown if currently breastfeeding. General: No acute distress.  Patient appears well-groomed.   Head:  Normocephalic/atraumatic Eyes:  Fundi examined but not visualized Neck: supple, no paraspinal tenderness, full range of motion Heart:  Regular rate and rhythm Lungs:  Clear to auscultation bilaterally Back: No paraspinal tenderness Neurological Exam: alert and oriented to person, place, and time.  Speech fluent and not dysarthric, language intact.  CN II-XII intact. Bulk and tone normal, muscle strength 5/5 throughout.  Sensation to light touch intact.  Deep tendon reflexes 2+ throughout, toes downgoing.  Finger to nose testing intact.  Gait normal, Romberg negative.   , DO  CC: Shon Millet, MD

## 2021-01-31 ENCOUNTER — Encounter: Payer: Self-pay | Admitting: Neurology

## 2021-01-31 ENCOUNTER — Other Ambulatory Visit: Payer: Self-pay

## 2021-01-31 ENCOUNTER — Ambulatory Visit: Payer: BC Managed Care – PPO | Admitting: Neurology

## 2021-01-31 VITALS — BP 112/66 | HR 89 | Ht 65.0 in | Wt 191.0 lb

## 2021-01-31 DIAGNOSIS — G43119 Migraine with aura, intractable, without status migrainosus: Secondary | ICD-10-CM

## 2021-01-31 DIAGNOSIS — G43109 Migraine with aura, not intractable, without status migrainosus: Secondary | ICD-10-CM | POA: Insufficient documentation

## 2021-01-31 MED ORDER — AIMOVIG 140 MG/ML ~~LOC~~ SOAJ: 140.0000 mg | SUBCUTANEOUS | 5 refills | Status: DC

## 2021-01-31 NOTE — Patient Instructions (Signed)
Take another Aimovig 70mg  today and then in 28 days, start 140mg  every 28 days thereafter At earliest onset of migraine, take Trudhesa nasal spray - 1 spray in each nostril.  May repeat in 1 hour (maximum 2 doses in 24 hours).  If effective, contact me for prescription.

## 2021-03-16 ENCOUNTER — Other Ambulatory Visit: Payer: Self-pay

## 2021-03-16 MED ORDER — AJOVY 225 MG/1.5ML ~~LOC~~ SOAJ: 225.0000 mg | SUBCUTANEOUS | 5 refills | Status: DC

## 2021-03-16 NOTE — Progress Notes (Signed)
Elevated BP on Aimovig, Per Dr.Jaffe please ask pt if she agrees to change to Ajovy.   Pt agrees. New script sent Ajovy 225 mg #1 with 5 refills sent to pharmacy on file.

## 2021-03-22 ENCOUNTER — Telehealth: Payer: Self-pay

## 2021-03-22 NOTE — Telephone Encounter (Signed)
New message   Status Sent to Plan today  Harless Litten Key: [id number] - PA Case ID: [id number] Need help? Call us at [phone]  Drug AJOVY (fremanezumab-vfrm) injection 225MG /1.5ML auto-injectors Form PA Form [phone] NCPDP)

## 2021-03-25 NOTE — Telephone Encounter (Signed)
F/U  Outcome Approved on September 8   PA Case: [id number], Status: Approved, Coverage Starts on: 03/24/2021 12:00:00 AM, Coverage Ends on: 06/22/2021 12:00:00 AM.

## 2021-03-25 NOTE — Telephone Encounter (Signed)
F/U  Outcome Approved on September 8   PA Case: [id number], Status: Approved, Coverage Starts on: 03/24/2021 12:00:00 AM, Coverage Ends on: 06/22/2021 12:00:00 AM. 

## 2021-06-07 ENCOUNTER — Telehealth: Payer: Self-pay

## 2021-06-07 NOTE — Telephone Encounter (Signed)
New message - website CoverMyMeds  Your information has been sent to [facility].  Yvonne Davila (Key: [id number]) Aimovig 70MG /ML auto-injectors   Form PA Form (2017 NCPDP) Created 3 days ago Sent to Plan 16 minutes ago Plan Response 16 minutes ago Submit Clinical Questions less than a minute ago Determination Wait for Determination Please wait for 05-06-2005 2017 to return a determination.

## 2021-06-14 NOTE — Telephone Encounter (Signed)
[name] (Key: [id number]) Aimovig 70MG /ML auto-injectors   Form PA Form (2017 NCPDP) Created 10 days ago Sent to Plan 7 days ago Plan Response 7 days ago Submit Clinical Questions 7 days ago Determination Favorable 7 days ago Message from Plan PA Case: 05-06-2005, Status: Approved, Coverage Starts on: 06/07/2021 12:00:00 AM, Coverage Ends on: 06/07/2022 12:00:00 AM.

## 2021-06-30 ENCOUNTER — Telehealth: Payer: Self-pay

## 2021-06-30 NOTE — Telephone Encounter (Signed)
New message   Your information has been sent to [facility].  Harless Litten Key: [id number] - PA Case ID: [id number] Need help? Call us at [phone] Status Sent to [facility] AJOVY (fremanezumab-vfrm) injection 225MG /1.5ML auto-injectors Form PA Form [phone] NCPDP)

## 2021-07-04 NOTE — Telephone Encounter (Signed)
This request has received a Favorable outcome.  Please note any additional information provided by [facility] at the bottom of this request.  Harless Litten Key: [id number] - PA Case ID: [id number] Need help? Call us at [phone] Outcome Approvedon December 16 PA Case: [id number], Status: Approved, Coverage Starts on: 07/01/2021 12:00:00 AM, Coverage Ends on: 07/01/2022 12:00:00 AM. Drug AJOVY (fremanezumab-vfrm) injection 225MG /1.5ML auto-injectors Form PA Form [phone] NCPDP)

## 2021-08-22 ENCOUNTER — Encounter: Payer: Self-pay | Admitting: Neurology

## 2021-08-22 ENCOUNTER — Telehealth: Payer: BC Managed Care – PPO | Admitting: Neurology

## 2021-08-22 ENCOUNTER — Other Ambulatory Visit: Payer: Self-pay

## 2021-08-22 VITALS — Ht 65.0 in | Wt 185.0 lb

## 2021-08-22 DIAGNOSIS — G43119 Migraine with aura, intractable, without status migrainosus: Secondary | ICD-10-CM | POA: Diagnosis not present

## 2021-08-22 MED ORDER — ZEMBRACE SYMTOUCH 3 MG/0.5ML ~~LOC~~ SOAJ: 3.0000 mg | Freq: Once | SUBCUTANEOUS | 5 refills | Status: DC | PRN

## 2021-08-22 MED ORDER — VENLAFAXINE HCL ER 150 MG PO CP24: 150.0000 mg | ORAL_CAPSULE | Freq: Every day | ORAL | 5 refills | Status: DC

## 2021-08-22 MED ORDER — PREDNISONE 10 MG PO TABS: ORAL_TABLET | ORAL | 0 refills | Status: DC

## 2021-08-22 NOTE — Patient Instructions (Signed)
Stop Ajovy.  Increase venlafaxine XR to 150mg  daily.  Give me update in 8 weeks At earliest onset of migraine, use Zembrace SymTouch imitrex shot.  May repeat once after 1 hour (maximum 2 injections in 24 hours). To break current intractable migraine, take prednisone taper as directed.  Do not take with NSAIDs as it may upset stomach Limit use of pain relievers to no more than 2 days out of week to prevent risk of rebound or medication-overuse headache. Keep headache diary Follow up 4 months.

## 2021-08-22 NOTE — Progress Notes (Signed)
Virtual Visit via Video Note The purpose of this virtual visit is to provide medical care while limiting exposure to the novel coronavirus.    Consent was obtained for video visit:  Yes.   Answered questions that patient had about telehealth interaction:  Yes.   I discussed the limitations, risks, security and privacy concerns of performing an evaluation and management service by telemedicine. I also discussed with the patient that there may be a patient responsible charge related to this service. The patient expressed understanding and agreed to proceed.  Pt location: Home Physician Location: office Name of referring provider:  Stoney Bang, FNP I connected with Yvonne Davila at patients initiation/request on 08/22/2021 at  3:30 PM EST by video enabled telemedicine application and verified that I am speaking with the correct person using two identifiers. Pt MRN:  [id number] Pt DOB:  1981-03-17 Video Participants:  Yvonne Davila;  Assessment and Plan:   Migraine with aura, without status migrainosus, intractable    Prednisone taper to break current intractable migraine Migraine prevention:  Stop Ajovy.  Increase venlafaxine XR to 150mg  daily Migraine rescue:  Zembrace SymTouch (sent to [facility]) Limit use of pain relievers to no more than 2 days out of week to prevent risk of rebound or medication-overuse headache. Keep headache diary Follow up 4 months  History of Present Illness:  Yvonne Davila is a 41 year old right-handed white female who follows up for migraines.  She is accompanied by her husband.   UPDATE: Increased Aimovig in July.  However, she developed elevated blood pressure, so she was switched to Ajovy.   August was ineffective. Intensity:  moderate to severe Duration:  2 to 3 days Frequency:  4 severe migraines a month (total 12 headache days a month) She has had an ongoing migraine since Friday.   Current NSAIDS:  Ibuprofen.  Contraindicated but  may need to take Motrin (EOE, ulcer) Current analgesics:  Tylenol Current triptans:  none Current ergotamine:  none Current anti-emetic:  Zofran ODT Current muscle relaxants:  none Current anti-anxiolytic:  none Current sleep aide:  none Current Antihypertensive medications:  spironolactone Current Antidepressant medications:  Venlafaxine XR 75mg  Current Anticonvulsant medications:  none Current anti-CGRP:  Ajovy Current Vitamins/Herbal/Supplements:  D3 Current Antihistamines/Decongestants:  none Other therapy:  rest Hormone/birth control:  Mirena   Caffeine:   1 cup coffee in morning Diet:  At least 60 oz water daily.  Stopped soda.  Occasionally skips meals Exercise:  Not routine Depression:  no; Anxiety:  Some, not significant Other pain:  no Sleep:  6 to 7 hours.  Has OSA but not always able to tolerate CPAP.    HISTORY:  Onset:  Since her 41s Location:  Usually bifrontal/retro-orbital Quality:  throbbing.  Less severe headaches are pressure-like Initial Intensity:  Moderate.  She denies new headache, thunderclap headache Aura:  Preceded by kaleidoscope vision followed by black out of vision for 5 to 20 minutes Premonitory Phase:  none Postdrome:  Groggy or dull residual ache the next day Associated symptoms:  Nausea, vomiting, photophobia, phonophobia.  She denies associated unilateral numbness or weakness. Initial Duration:  4 to 10 hours Initial Frequency:  1 to 2 a week Initial Frequency of abortive medication: 1 to 2 days a week at most Triggers:  unknown Relieving factors:  none Activity:  aggravates   CT head without contrast performed on 10/10/2019 showed "No acute intracranial findings.  Mild global atrophy which appears advanced for the patient's  age."  As a follow up of the CT from March, she had an MRI of the brain on 07/05/2020, which was personally reviewed and again demonstrated mild generalized atrophy. This finding has been concerning for the patient and is  wondering what should be done.  She is concerned about Alzheimer's because her maternal grandfather was diagnosed with AD in his 63s and her maternal aunt was diagnosed in her 23s.   Eye exam was okay.     Past NSAIDS:  ibuprofen Past analgesics:  Excedrin, Tylenol, Fioricet with Codeine Past abortive triptans:  Sumatriptan tablet (makes her feel sick), Tosymra NS Past abortive ergotamine:  Trudhesa NS Past muscle relaxants:  none Past anti-emetic:  Zofran 4mg  Past antihypertensive medications:  Propranolol (caused drowsiness) Past antidepressant medications:  none Past anticonvulsant medications:  topiramate 100mg  (depression, fatigue) Past anti-CGRP:  Ubrelvy, Aimovig (caused elevated blood pressure) Past vitamins/Herbal/Supplements:  none Past antihistamines/decongestants:  dramamine Other past therapies:  none     Family history of headache:  Twin sister (migraine); mom (migraine)  Past Medical History: No past medical history on file.  Medications: Outpatient Encounter Medications as of 08/22/2021  Medication Sig Note   acyclovir (ZOVIRAX) 400 MG tablet Take 400 mg by mouth 2 (two) times daily. 07/19/2020: As needed   Erenumab-aooe (AIMOVIG) 140 MG/ML SOAJ Inject 140 mg into the skin every 28 (twenty-eight) days.    Fremanezumab-vfrm (AJOVY) 225 MG/1.5ML SOAJ Inject 225 mg into the skin every 28 (twenty-eight) days.    ondansetron (ZOFRAN ODT) 4 MG disintegrating tablet Take 1 tablet (4 mg total) by mouth every 8 (eight) hours as needed for nausea or vomiting.    pantoprazole (PROTONIX) 40 MG tablet Take 40 mg by mouth daily.    spironolactone (ALDACTONE) 50 MG tablet Take 50 mg by mouth daily. (Patient not taking: Reported on 01/31/2021)    venlafaxine XR (EFFEXOR-XR) 75 MG 24 hr capsule Take 75 mg by mouth 1 day or 1 dose.    No facility-administered encounter medications on file as of 08/22/2021.    Allergies: Allergies  Allergen Reactions   Amoxicillin-Pot Clavulanate      Family History: No family history on file.  Observations/Objective:    No acute distress.  Alert and oriented.  Speech fluent and not dysarthric.  Language intact.     Follow Up Instructions:    -I discussed the assessment and treatment plan with the patient. The patient was provided an opportunity to ask questions and all were answered. The patient agreed with the plan and demonstrated an understanding of the instructions.   The patient was advised to call back or seek an in-person evaluation if the symptoms worsen or if the condition fails to improve as anticipated.   02/02/2021, DO

## 2021-08-24 ENCOUNTER — Telehealth: Payer: Self-pay

## 2021-08-24 NOTE — Telephone Encounter (Signed)
New message   Yvonne Davila (Key: [id number]) [name] SymTouch 3MG /0.5ML auto-injectors   Form PA Form [phone] NCPDP) Created 19 hours ago Sent to Plan 19 hours ago Plan Response 19 hours ago Submit Clinical Questions less than a minute ago Determination Favorable less than a minute ago Message from Plan PA Case: ([id number], Status: Approved, Coverage Starts on: 08/24/2021 12:00:00 AM, Coverage Ends on: 08/24/2022 12:00:00 AM.

## 2021-09-26 ENCOUNTER — Other Ambulatory Visit: Payer: Self-pay | Admitting: Neurology

## 2021-12-19 NOTE — Progress Notes (Unsigned)
NEUROLOGY FOLLOW UP OFFICE NOTE  [name] [id number]  Assessment/Plan:   Migraine with aura, without status migrainosus, intractable    Migraine prevention:  venlafaxine XR to 150mg  daily Migraine rescue:  Zembrace SymTouch (sent to [facility]) Limit use of pain relievers to no more than 2 days out of week to prevent risk of rebound or medication-overuse headache. Keep headache diary Follow up 4 months    Subjective:  . Yvonne Davila is a 41 year old right-handed white female who follows up for migraines.  She is accompanied by her husband.   UPDATE: Discontinued Ajovy and increased venlafaxine.  *** Intensity:  moderate to severe Duration:  2 to 3 days Frequency:  4 severe migraines a month (total 12 headache days a month) She has had an ongoing migraine since Friday.   Current NSAIDS:  Ibuprofen.  Contraindicated but may need to take Motrin (EOE, ulcer) Current analgesics:  Tylenol Current triptans:  Zembrace SymTouch Current ergotamine:  none Current anti-emetic:  Zofran ODT Current muscle relaxants:  none Current anti-anxiolytic:  none Current sleep aide:  none Current Antihypertensive medications:  spironolactone Current Antidepressant medications:  Venlafaxine XR 150mg  Current Anticonvulsant medications:  none Current anti-CGRP:  Ajovy Current Vitamins/Herbal/Supplements:  D3 Current Antihistamines/Decongestants:  none Other therapy:  rest Hormone/birth control:  Mirena   Caffeine:   1 cup coffee in morning Diet:  At least 60 oz water daily.  Stopped soda.  Occasionally skips meals Exercise:  Not routine Depression:  no; Anxiety:  Some, not significant Other pain:  no Sleep:  6 to 7 hours.  Has OSA but not always able to tolerate CPAP.    HISTORY:  Onset:  Since her 12s Location:  Usually bifrontal/retro-orbital Quality:  throbbing.  Less severe headaches are pressure-like Initial Intensity:  Moderate.  She denies new headache, thunderclap  headache Aura:  Preceded by kaleidoscope vision followed by black out of vision for 5 to 20 minutes Premonitory Phase:  none Postdrome:  Groggy or dull residual ache the next day Associated symptoms:  Nausea, vomiting, photophobia, phonophobia.  She denies associated unilateral numbness or weakness. Initial Duration:  4 to 10 hours Initial Frequency:  1 to 2 a week Initial Frequency of abortive medication: 1 to 2 days a week at most Triggers:  unknown Relieving factors:  none Activity:  aggravates   CT head without contrast performed on 10/10/2019 showed "No acute intracranial findings.  Mild global atrophy which appears advanced for the patient's age."  As a follow up of the CT from March, she had an MRI of the brain on 07/05/2020, which was personally reviewed and again demonstrated mild generalized atrophy. This finding has been concerning for the patient and is wondering what should be done.  She is concerned about Alzheimer's because her maternal grandfather was diagnosed with AD in his 68s and her maternal aunt was diagnosed in her 15s.   Eye exam was okay.     Past NSAIDS:  ibuprofen Past analgesics:  Excedrin, Tylenol, Fioricet with Codeine Past abortive triptans:  Sumatriptan tablet (makes her feel sick), Tosymra NS Past abortive ergotamine:  Trudhesa NS Past muscle relaxants:  none Past anti-emetic:  Zofran 4mg  Past antihypertensive medications:  Propranolol (caused drowsiness) Past antidepressant medications:  none Past anticonvulsant medications:  topiramate 100mg  (depression, fatigue) Past anti-CGRP:  Ubrelvy, Aimovig (caused elevated blood pressure), Ajovy Past vitamins/Herbal/Supplements:  none Past antihistamines/decongestants:  dramamine Other past therapies:  none     Family history of headache:  Twin sister (migraine); mom (migraine)  PAST MEDICAL HISTORY: No past medical history on file.  MEDICATIONS: Current Outpatient Medications on File Prior to Visit   Medication Sig Dispense Refill   acyclovir (ZOVIRAX) 400 MG tablet Take 400 mg by mouth 2 (two) times daily.     ALPRAZolam (XANAX) 0.25 MG tablet Take 0.25 mg by mouth daily as needed.     Erenumab-aooe (AIMOVIG) 140 MG/ML SOAJ Inject 140 mg into the skin every 28 (twenty-eight) days. 1.12 mL 5   metoprolol succinate (TOPROL-XL) 25 MG 24 hr tablet Take by mouth.     ondansetron (ZOFRAN-ODT) 4 MG disintegrating tablet DISSOLVE 1 TABLET(4 MG) ON THE TONGUE EVERY 8 HOURS AS NEEDED FOR NAUSEA OR VOMITING 20 tablet 0   pantoprazole (PROTONIX) 40 MG tablet Take 40 mg by mouth daily.     predniSONE (DELTASONE) 10 MG tablet Take 60mg  on day 1, then 50mg  on day 2, then 40mg  on day 3, then 30mg  on day 4, then 20mg  on day 5, then 10mg  on day 6, then STOP 21 tablet 0   spironolactone (ALDACTONE) 50 MG tablet Take 50 mg by mouth daily. (Patient not taking: Reported on 01/31/2021)     SUMAtriptan Succinate (ZEMBRACE SYMTOUCH) 3 MG/0.5ML SOAJ Inject 3 mg into the skin once as needed for up to 1 dose (May repeat after 1 hour.  Maximum 2 injections in 24 hours.). 4 mL 5   venlafaxine XR (EFFEXOR-XR) 150 MG 24 hr capsule Take 1 capsule (150 mg total) by mouth daily with breakfast. 30 capsule 5   No current facility-administered medications on file prior to visit.    ALLERGIES: Allergies  Allergen Reactions   Amoxicillin-Pot Clavulanate     FAMILY HISTORY: No family history on file.    Objective:  *** General: No acute distress.  Patient appears well-groomed.   Head:  Normocephalic/atraumatic Eyes:  Fundi examined but not visualized Neck: supple, no paraspinal tenderness, full range of motion Heart:  Regular rate and rhythm Lungs:  Clear to auscultation bilaterally Back: No paraspinal tenderness Neurological Exam: alert and oriented to person, place, and time.  Speech fluent and not dysarthric, language intact.  CN II-XII intact. Bulk and tone normal, muscle strength 5/5 throughout.  Sensation to  light touch intact.  Deep tendon reflexes 2+ throughout, toes downgoing.  Finger to nose testing intact.  Gait normal, Romberg negative.   , DO  CC: , FNP

## 2021-12-20 ENCOUNTER — Ambulatory Visit: Payer: BC Managed Care – PPO | Admitting: Neurology

## 2021-12-20 ENCOUNTER — Encounter: Payer: Self-pay | Admitting: Neurology

## 2021-12-20 VITALS — BP 117/71 | HR 112 | Ht 65.0 in | Wt 191.2 lb

## 2021-12-20 DIAGNOSIS — G43109 Migraine with aura, not intractable, without status migrainosus: Secondary | ICD-10-CM

## 2021-12-20 DIAGNOSIS — G43709 Chronic migraine without aura, not intractable, without status migrainosus: Secondary | ICD-10-CM | POA: Diagnosis not present

## 2021-12-20 MED ORDER — VENLAFAXINE HCL ER 75 MG PO CP24: 75.0000 mg | ORAL_CAPSULE | Freq: Every day | ORAL | 5 refills | Status: AC

## 2021-12-20 NOTE — Patient Instructions (Signed)
Will start Botox Decrease venlafaxine XR back to 75mg  daily Zembrace as needed. Limit use of pain relievers to no more than 2 days out of week to prevent risk of rebound or medication-overuse headache. Keep headache diary Follow up 6 months

## 2021-12-23 ENCOUNTER — Encounter: Payer: Self-pay | Admitting: Neurology

## 2022-01-05 ENCOUNTER — Other Ambulatory Visit (HOSPITAL_COMMUNITY): Payer: Self-pay

## 2022-01-05 NOTE — Telephone Encounter (Addendum)
Started  Prior Authorization request to [employer] for  Botox  via CoverMyMeds. Awaiting additional info from clinical staff.  (Key: [id number]) - [id number]   Submitted benefits in [employer] One- [id number].

## 2022-01-12 ENCOUNTER — Telehealth (HOSPITAL_COMMUNITY): Payer: Self-pay | Admitting: Pharmacy Technician

## 2022-01-12 ENCOUNTER — Other Ambulatory Visit (HOSPITAL_COMMUNITY): Payer: Self-pay

## 2022-01-12 MED ORDER — BOTOX 200 UNITS IJ SOLR
INTRAMUSCULAR | 4 refills | Status: DC
  Filled 2022-01-12: qty 1, fill #0
  Filled 2022-02-15: qty 1, 90d supply, fill #0
  Filled 2022-05-16: qty 1, 90d supply, fill #1

## 2022-01-12 NOTE — Telephone Encounter (Signed)
Patient Advocate Encounter  Prior Authorization for Botox 200UNIT solution  has been approved.    PA# [id number] Effective dates: 01/12/2022 through 07/11/2022  Medication can be filled at [facility]    Roland Earl, CPhT Pharmacy Patient Advocate Specialist [facility] Patient Advocate Team Direct Number: [phone]  Fax: [phone]

## 2022-01-12 NOTE — Telephone Encounter (Signed)
Patient Advocate Encounter   Received notification that prior authorization for Botox 200UNIT solution is required.   PA submitted on 01/12/2022 Key [id number] Status is pending       Yvonne Davila, CPhT Pharmacy Patient Advocate Specialist [facility] Patient Advocate Team Direct Number: [phone]  Fax: [phone]

## 2022-01-12 NOTE — Addendum Note (Signed)
Addended by: Leida Lauth on: 01/12/2022 02:56 PM   Modules accepted: Orders

## 2022-01-12 NOTE — Telephone Encounter (Signed)
Script sent to [facility]. Patient advised of approval through mychart.

## 2022-01-31 ENCOUNTER — Other Ambulatory Visit (HOSPITAL_COMMUNITY): Payer: Self-pay

## 2022-02-09 ENCOUNTER — Other Ambulatory Visit: Payer: Self-pay | Admitting: Neurology

## 2022-02-15 ENCOUNTER — Other Ambulatory Visit (HOSPITAL_COMMUNITY): Payer: Self-pay

## 2022-02-17 ENCOUNTER — Other Ambulatory Visit (HOSPITAL_COMMUNITY): Payer: Self-pay

## 2022-02-27 ENCOUNTER — Other Ambulatory Visit (HOSPITAL_COMMUNITY): Payer: Self-pay

## 2022-02-28 ENCOUNTER — Other Ambulatory Visit (HOSPITAL_COMMUNITY): Payer: Self-pay

## 2022-03-03 ENCOUNTER — Ambulatory Visit: Payer: BC Managed Care – PPO | Admitting: Neurology

## 2022-03-03 DIAGNOSIS — G43109 Migraine with aura, not intractable, without status migrainosus: Secondary | ICD-10-CM | POA: Diagnosis not present

## 2022-03-03 MED ORDER — ONABOTULINUMTOXINA 200 UNITS IJ SOLR
200.0000 [IU] | Freq: Once | INTRAMUSCULAR | Status: AC
  Administered 2022-03-03: 155 [IU] via INTRAMUSCULAR

## 2022-03-03 NOTE — Progress Notes (Signed)
Botulinum Clinic  ° °Procedure Note Botox ° °Attending: Dr. Rachelle Edwards ° °Preoperative Diagnosis(es): Chronic migraine ° °Consent obtained from: The patient °Benefits discussed included, but were not limited to decreased muscle tightness, increased joint range of motion, and decreased pain.  Risk discussed included, but were not limited pain and discomfort, bleeding, bruising, excessive weakness, venous thrombosis, muscle atrophy and dysphagia.  Anticipated outcomes of the procedure as well as he risks and benefits of the alternatives to the procedure, and the roles and tasks of the personnel to be involved, were discussed with the patient, and the patient consents to the procedure and agrees to proceed. A copy of the patient medication guide was given to the patient which explains the blackbox warning. ° °Patients identity and treatment sites confirmed Yes.  . ° °Details of Procedure: °Skin was cleaned with alcohol. Prior to injection, the needle plunger was aspirated to make sure the needle was not within a blood vessel.  There was no blood retrieved on aspiration.   ° °Following is a summary of the muscles injected  And the amount of Botulinum toxin used: ° °Dilution °200 units of Botox was reconstituted with 4 ml of preservative free normal saline. °Time of reconstitution: At the time of the office visit (<30 minutes prior to injection)  ° °Injections  °155 total units of Botox was injected with a 30 gauge needle. ° °Injection Sites: °L occipitalis: 15 units- 3 sites  °R occiptalis: 15 units- 3 sites ° °L upper trapezius: 15 units- 3 sites °R upper trapezius: 15 units- 3 sits          °L paraspinal: 10 units- 2 sites °R paraspinal: 10 units- 2 sites ° °Face °L frontalis(2 injection sites):10 units   °R frontalis(2 injection sites):10 units         °L corrugator: 5 units   °R corrugator: 5 units           °Procerus: 5 units   °L temporalis: 20 units °R temporalis: 20 units  ° °Agent:  °200 units of botulinum Type  A (Onobotulinum Toxin type A) was reconstituted with 4 ml of preservative free normal saline.  °Time of reconstitution: At the time of the office visit (<30 minutes prior to injection)  ° ° ° Total injected (Units):  155 ° Total wasted (Units):  45 ° °Patient tolerated procedure well without complications.   °Reinjection is anticipated in 3 months. ° ° °

## 2022-03-14 ENCOUNTER — Encounter: Payer: Self-pay | Admitting: Neurology

## 2022-05-06 ENCOUNTER — Other Ambulatory Visit: Payer: Self-pay | Admitting: Neurology

## 2022-05-08 ENCOUNTER — Other Ambulatory Visit (HOSPITAL_COMMUNITY): Payer: Self-pay

## 2022-05-14 ENCOUNTER — Other Ambulatory Visit: Payer: Self-pay | Admitting: Neurology

## 2022-05-16 ENCOUNTER — Other Ambulatory Visit (HOSPITAL_COMMUNITY): Payer: Self-pay

## 2022-05-17 ENCOUNTER — Other Ambulatory Visit (HOSPITAL_COMMUNITY): Payer: Self-pay

## 2022-05-24 ENCOUNTER — Other Ambulatory Visit (HOSPITAL_COMMUNITY): Payer: Self-pay

## 2022-05-26 ENCOUNTER — Other Ambulatory Visit (HOSPITAL_COMMUNITY): Payer: Self-pay

## 2022-05-30 ENCOUNTER — Encounter: Payer: Self-pay | Admitting: Neurology

## 2022-06-02 ENCOUNTER — Ambulatory Visit (INDEPENDENT_AMBULATORY_CARE_PROVIDER_SITE_OTHER): Payer: BC Managed Care – PPO | Admitting: Neurology

## 2022-06-02 DIAGNOSIS — G43709 Chronic migraine without aura, not intractable, without status migrainosus: Secondary | ICD-10-CM | POA: Diagnosis not present

## 2022-06-02 MED ORDER — ONABOTULINUMTOXINA 100 UNITS IJ SOLR
200.0000 [IU] | Freq: Once | INTRAMUSCULAR | Status: AC
  Administered 2022-06-02: 155 [IU] via INTRAMUSCULAR

## 2022-06-02 NOTE — Progress Notes (Signed)
[facility]ulinum Clinic  ° °Procedure Note Botox ° °Attending: Dr. Tyronica Truxillo ° °Preoperative Diagnosis(es): Chronic migraine ° °Consent obtained from: The patient °Benefits discussed included, but were not limited to decreased muscle tightness, increased joint range of motion, and decreased pain.  Risk discussed included, but were not limited pain and discomfort, bleeding, bruising, excessive weakness, venous thrombosis, muscle atrophy and dysphagia.  Anticipated outcomes of the procedure as well as he risks and benefits of the alternatives to the procedure, and the roles and tasks of the personnel to be involved, were discussed with the patient, and the patient consents to the procedure and agrees to proceed. A copy of the patient medication guide was given to the patient which explains the blackbox warning. ° °Patients identity and treatment sites confirmed Yes.  . ° °Details of Procedure: °Skin was cleaned with alcohol. Prior to injection, the needle plunger was aspirated to make sure the needle was not within a blood vessel.  There was no blood retrieved on aspiration.   ° °Following is a summary of the muscles injected  And the amount of Botulinum toxin used: ° °Dilution °200 units of Botox was reconstituted with 4 ml of preservative free normal saline. °Time of reconstitution: At the time of the office visit (<30 minutes prior to injection)  ° °Injections  °155 total units of Botox was injected with a 30 gauge needle. ° °Injection Sites: °L occipitalis: 15 units- 3 sites  °R occiptalis: 15 units- 3 sites ° °L upper trapezius: 15 units- 3 sites °R upper trapezius: 15 units- 3 sits          °L paraspinal: 10 units- 2 sites °R paraspinal: 10 units- 2 sites ° °Face °L frontalis(2 injection sites):10 units   °R frontalis(2 injection sites):10 units         °L corrugator: 5 units   °R corrugator: 5 units           °Procerus: 5 units   °L temporalis: 20 units °R temporalis: 20 units  ° °Agent:  °200 units of botulinum Type  A (Onobotulinum Toxin type A) was reconstituted with 4 ml of preservative free normal saline.  °Time of reconstitution: At the time of the office visit (<30 minutes prior to injection)  ° ° ° Total injected (Units):  155 ° Total wasted (Units):  45 ° °Patient tolerated procedure well without complications.   °Reinjection is anticipated in 3 months. ° ° °

## 2022-06-21 ENCOUNTER — Other Ambulatory Visit: Payer: Self-pay | Admitting: Neurology

## 2022-06-21 ENCOUNTER — Encounter: Payer: Self-pay | Admitting: Neurology

## 2022-06-21 NOTE — Progress Notes (Deleted)
Virtual Visit via Video Note  Consent was obtained for video visit:  Yes.   Answered questions that patient had about telehealth interaction:  Yes.   I discussed the limitations, risks, security and privacy concerns of performing an evaluation and management service by telemedicine. I also discussed with the patient that there may be a patient responsible charge related to this service. The patient expressed understanding and agreed to proceed.  Pt location: Home Physician Location: office Name of referring provider:  Stoney BangWinikur, Melinda, FNP I connected with Yisroel RammingEmily S Kniss at patients initiation/request on 06/26/2022 at  3:30 PM EST by video enabled telemedicine application and verified that I am speaking with the correct person using two identifiers. Pt MRN:  [id number] Pt DOB:  Nov 15, 1980 Video Participants:  Yisroel RammingEmily S Kunzler   Assessment/Plan:   *** migraine with and without aura, without status migrainosus, not intractable   Migraine prevention:  Botox. Migraine rescue:  Zembrace SymTouch (sent to [facility]) Limit use of pain relievers to no more than 2 days out of week to prevent risk of rebound or medication-overuse headache. Keep headache diary Follow up 6 months    Subjective:  Yvonne Davila is a 41 year old right-handed white female who follows up for migraines.  She is accompanied by her husband.   UPDATE: Status post 2 rounds of Botox.  *** Intensity:  moderate to severe Duration:  within an hour if takes Zembrace earliest onset, otherwise less than a day (longer with change in weather) Frequency:  15 headache days a month (5 are "migraine") She has had an ongoing migraine since Friday.   Current NSAIDS:  Ibuprofen.  Contraindicated but may need to take Motrin (EOE, ulcer) Current analgesics:  Tylenol Current triptans:  Zembrace SymTouch Current ergotamine:  none Current anti-emetic:  Zofran ODT Current muscle relaxants:  none Current anti-anxiolytic:   none Current sleep aide:  none Current Antihypertensive medications:  spironolactone Current Antidepressant medications:  Venlafaxine XR 150mg  Current Anticonvulsant medications:  none Current anti-CGRP:  none Current Vitamins/Herbal/Supplements:  D3 Current Antihistamines/Decongestants:  none Other therapy:  rest Hormone/birth control:  Mirena   Caffeine:   1 cup coffee in morning Diet:  At least 60 oz water daily.  Stopped soda.  Occasionally skips meals Exercise:  Not routine Depression:  no; Anxiety:  Some, not significant Other pain:  no Sleep:  6 to 7 hours.  Has OSA but not always able to tolerate CPAP.    HISTORY:  Onset:  Since her 2520s Location:  Usually bifrontal/retro-orbital Quality:  throbbing.  Less severe headaches are pressure-like Initial Intensity:  Moderate.  She denies new headache, thunderclap headache Aura:  Preceded by kaleidoscope vision followed by black out of vision for 5 to 20 minutes Premonitory Phase:  none Postdrome:  Groggy or dull residual ache the next day Associated symptoms:  Nausea, vomiting, photophobia, phonophobia.  She denies associated unilateral numbness or weakness. Initial Duration:  4 to 10 hours Initial Frequency:  1 to 2 a week Initial Frequency of abortive medication: 1 to 2 days a week at most Triggers:  unknown Relieving factors:  none Activity:  aggravates   CT head without contrast performed on 10/10/2019 showed "No acute intracranial findings.  Mild global atrophy which appears advanced for the patient's age."  As a follow up of the CT from March, she had an MRI of the brain on 07/05/2020, which was personally reviewed and again demonstrated mild generalized atrophy. This finding has been concerning for  the patient and is wondering what should be done.  She is concerned about Alzheimer's because her maternal grandfather was diagnosed with AD in his 79s and her maternal aunt was diagnosed in her 73s.   Eye exam was okay.      Past NSAIDS:  ibuprofen Past analgesics:  Excedrin, Tylenol, Fioricet with Codeine Past abortive triptans:  Sumatriptan tablet (makes her feel sick), Tosymra NS Past abortive ergotamine:  Trudhesa NS Past muscle relaxants:  none Past anti-emetic:  Zofran 4mg  Past antihypertensive medications:  Propranolol (caused drowsiness) Past antidepressant medications:  none Past anticonvulsant medications:  topiramate 100mg  (depression, fatigue) Past anti-CGRP:  Ubrelvy, Aimovig (caused elevated blood pressure), Ajovy Past vitamins/Herbal/Supplements:  none Past antihistamines/decongestants:  dramamine Other past therapies:  none     Family history of headache:  Twin sister (migraine); mom (migraine)  Past Medical History: No past medical history on file.  Medications: Outpatient Encounter Medications as of 06/26/2022  Medication Sig   ALPRAZolam (XANAX) 0.25 MG tablet Take 0.25 mg by mouth daily as needed.   botulinum toxin Type A (BOTOX) 200 units injection Inject 155 units IM into multiple site in the face,neck and head once every 90 days   ondansetron (ZOFRAN-ODT) 4 MG disintegrating tablet DISSOLVE 1 TABLET(4 MG) ON THE TONGUE EVERY 8 HOURS AS NEEDED FOR NAUSEA OR VOMITING   pantoprazole (PROTONIX) 40 MG tablet Take 40 mg by mouth daily.   SUMAtriptan Succinate (ZEMBRACE SYMTOUCH) 3 MG/0.5ML SOAJ Inject 3 mg into the skin once as needed for up to 1 dose (May repeat after 1 hour.  Maximum 2 injections in 24 hours.).   venlafaxine XR (EFFEXOR-XR) 75 MG 24 hr capsule Take 1 capsule (75 mg total) by mouth daily with breakfast.   [DISCONTINUED] venlafaxine XR (EFFEXOR-XR) 150 MG 24 hr capsule Take 1 capsule (150 mg total) by mouth daily with breakfast.   No facility-administered encounter medications on file as of 06/26/2022.    Allergies: Allergies  Allergen Reactions   Amoxicillin-Pot Clavulanate     Family History: No family history on file.  Observations/Objective:   No acute  distress.  Alert and oriented.  Speech fluent and not dysarthric.  Language intact.     Follow Up Instructions:    -I discussed the assessment and treatment plan with the patient. The patient was provided an opportunity to ask questions and all were answered. The patient agreed with the plan and demonstrated an understanding of the instructions.   The patient was advised to call back or seek an in-person evaluation if the symptoms worsen or if the condition fails to improve as anticipated.   06-09-1991, DO  CC: 14/05/2022, FNP

## 2022-06-23 ENCOUNTER — Ambulatory Visit: Payer: BC Managed Care – PPO | Admitting: Neurology

## 2022-06-26 ENCOUNTER — Telehealth: Payer: BC Managed Care – PPO | Admitting: Neurology

## 2022-06-30 ENCOUNTER — Telehealth: Payer: Self-pay

## 2022-06-30 NOTE — Telephone Encounter (Signed)
Pharmacy Patient Advocate Encounter   Received notification that prior authorization for Botox 200UNIT solution is required/requested.  PA submitted on 06/30/2022  via CoverMyMeds  Key  [id number]  Status is pending

## 2022-06-30 NOTE — Telephone Encounter (Signed)
Botox One- Benefit Verification [id number] Submitted!

## 2022-07-04 NOTE — Telephone Encounter (Signed)
LMOM to make a follow up

## 2022-07-04 NOTE — Telephone Encounter (Signed)
Received a fax regarding Prior Authorization from [facility] for BOTOX. Authorization has been DENIED because:

## 2022-08-16 ENCOUNTER — Other Ambulatory Visit (HOSPITAL_COMMUNITY): Payer: Self-pay

## 2022-08-21 ENCOUNTER — Other Ambulatory Visit: Payer: Self-pay

## 2022-08-21 ENCOUNTER — Telehealth: Payer: Self-pay | Admitting: Pharmacy Technician

## 2022-08-21 NOTE — Telephone Encounter (Signed)
Patient Advocate Encounter  Prior Authorization for [employer] 3MG /0.5ML auto-injectors has been approved.    PA# [id number] Key: [id number] Effective dates: 08/21/2022 through 08/21/2023      Lyndel Safe, [place] Patient Advocate Specialist [place] Patient Advocate Team Direct Number: [phone]  Fax: [phone]

## 2022-09-01 ENCOUNTER — Other Ambulatory Visit (HOSPITAL_COMMUNITY): Payer: Self-pay

## 2022-09-01 ENCOUNTER — Ambulatory Visit: Payer: BC Managed Care – PPO | Admitting: Neurology

## 2022-09-08 ENCOUNTER — Other Ambulatory Visit (HOSPITAL_COMMUNITY): Payer: Self-pay

## 2022-12-25 NOTE — Progress Notes (Unsigned)
NEUROLOGY FOLLOW UP OFFICE NOTE  Yvonne Davila [id number]  Assessment/Plan:   Migraine with and without aura, without status migrainosus, not intractable.  Concern that her OSA is contributing to her frequent dull headaches Right TMJ dysfunction Obstructive sleep apnea, noncompliant with CPAP as she just cannot tolerate it.   Migraine prevention:  Qulipta 60mg  daily, venlafaxine XR 75mg  daily Migraine rescue:  Zembrace SymTouch (sent to [facility]). Use ibuprofen or acetaminophen for dull headache.  Limit use of pain relievers to no more than 2 days out of week to prevent risk of rebound or medication-overuse headache. Try wearing mouth guard at night to help reduce TMJ dysfunction symptoms.  If ineffective, would recommend seeing dentist. Advised to try wearing CPAP more regularly Keep headache diary Follow up 6 months     Subjective:  Yvonne Davila. Maniscalco is a 42 year old right-handed white female who follows up for migraines.  She is accompanied by her husband who supplements history.   UPDATE: Started Botox.  Had 2 rounds.  Last round was in November.  She didn't like the Botox, so she didn't continue (made her head feel "constricted").  It also wasn't effective.  Started Bennie Pierini by her PCP about 4 months ago.  Decreased severity but not frequency Intensity:  1-2/10 ("migraine" 7-8/10) Duration:  migraines within an hour if takes Zembrace earliest onset, otherwise less than a day (longer with change in weather).  With lesser headaches, 2-3 hours with ibuprofen (longer with Tylenol) Frequency:  10 days in last 4 weeks (1 considered "migraine".   Frequency of pain relievers:  1 to 2 days a week.    She now reports sharp shooting pain in the right ear.  Right side of scalp is tender to touch (a soreness).  It has been occurring a little over a week.  No discharge.  No change in hearing.  No ringing in the ear.  It was intermittent but now constant over the past 2 days.  Nothing  makes it worse (such as chewing).  She does clench her teeth but doesn't grind her teeth.     Current NSAIDS:  Ibuprofen.  Contraindicated but may need to take Motrin (EOE, ulcer) Current analgesics:  Tylenol Current triptans:  Zembrace SymTouch Current ergotamine:  none Current anti-emetic:  Zofran ODT Current muscle relaxants:  none Current anti-anxiolytic:  none Current sleep aide:  none Current Antihypertensive medications:  spironolactone Current Antidepressant medications:  Venlafaxine XR 75mg  (higher doses caused withdrawal symptoms if she delayed taking a dose) Current Anticonvulsant medications:  none Current anti-CGRP:  Qulipta 60mg  daily Current Vitamins/Herbal/Supplements:  D3 Current Antihistamines/Decongestants:  none Other therapy:  rest Hormone/birth control:  Mirena   Caffeine:   1 cup coffee in morning Diet:  At least 60 oz water daily.  Stopped soda.  Occasionally skips meals Exercise:  Not routine Depression:  no; Anxiety:  Some, not significant Other pain:  no Sleep:  6 to 7 hours.  Has OSA but not always able to tolerate CPAP.    HISTORY:  Onset:  Since her 7s Location:  Usually bifrontal/retro-orbital Quality:  throbbing.  Less severe headaches are pressure-like Initial Intensity:  Moderate.  She denies new headache, thunderclap headache Aura:  Preceded by kaleidoscope vision followed by black out of vision for 5 to 20 minutes Premonitory Phase:  none Postdrome:  Groggy or dull residual ache the next day Associated symptoms:  Nausea, vomiting, photophobia, phonophobia.  She denies associated unilateral numbness or weakness. Initial Duration:  4 to 10 hours Initial Frequency:  1 to 2 a week Initial Frequency of abortive medication: 1 to 2 days a week at most Triggers:  unknown Relieving factors:  none Activity:  aggravates   CT head without contrast performed on 10/10/2019 showed "No acute intracranial findings.  Mild global atrophy which appears  advanced for the patient's age."  As a follow up of the CT from March, she had an MRI of the brain on 07/05/2020, which was personally reviewed and again demonstrated mild generalized atrophy. This finding has been concerning for the patient and is wondering what should be done.  She is concerned about Alzheimer's because her maternal grandfather was diagnosed with AD in his 73s and her maternal aunt was diagnosed in her 81s.   Eye exam was okay.     Past NSAIDS:  ibuprofen Past analgesics:  Excedrin, Tylenol, Fioricet with Codeine Past abortive triptans:  Sumatriptan tablet (makes her feel sick), Tosymra NS Past abortive ergotamine:  Trudhesa NS Past muscle relaxants:  none Past anti-emetic:  Zofran 4mg  Past antihypertensive medications:  Propranolol (caused drowsiness) Past antidepressant medications:  none Past anticonvulsant medications:  topiramate 100mg  (depression, fatigue) Past anti-CGRP:  Ubrelvy, Aimovig (caused elevated blood pressure), Ajovy Past vitamins/Herbal/Supplements:  none Past antihistamines/decongestants:  dramamine Other past therapies:  Botox     Family history of headache:  Twin sister (migraine); mom (migraine)  PAST MEDICAL HISTORY: No past medical history on file.  MEDICATIONS: Current Outpatient Medications on File Prior to Visit  Medication Sig Dispense Refill   ALPRAZolam (XANAX) 0.25 MG tablet Take 0.25 mg by mouth daily as needed.     ondansetron (ZOFRAN-ODT) 4 MG disintegrating tablet DISSOLVE 1 TABLET(4 MG) ON THE TONGUE EVERY 8 HOURS AS NEEDED FOR NAUSEA OR VOMITING 20 tablet 0   pantoprazole (PROTONIX) 40 MG tablet Take 40 mg by mouth daily.     QULIPTA 60 MG TABS Take 1 tablet by mouth daily.     venlafaxine XR (EFFEXOR-XR) 75 MG 24 hr capsule Take 1 capsule (75 mg total) by mouth daily with breakfast. 30 capsule 5   [DISCONTINUED] venlafaxine XR (EFFEXOR-XR) 150 MG 24 hr capsule Take 1 capsule (150 mg total) by mouth daily with breakfast. 30  capsule 5   No current facility-administered medications on file prior to visit.      ALLERGIES: Allergies  Allergen Reactions   Amoxicillin-Pot Clavulanate     FAMILY HISTORY: No family history on file.    Objective:  Blood pressure 124/74, pulse 78, height 5\' 5"  (1.651 m), weight 193 lb 6.4 oz (87.7 kg), SpO2 98 %, unknown if currently breastfeeding. General: No acute distress.  Patient appears well-groomed.   Head:  Normocephalic/atraumatic.  Tenderness to palpation of right TMJ Eyes:  Fundi examined but not visualized Ear:  TM intact on right. No evidence of inflammation Neck: supple, right upper cervical paraspinal tenderness, full range of motion Heart:  Regular rate and rhythm Lungs:  Clear to auscultation bilaterally Back: No paraspinal tenderness Neurological Exam: alert and oriented.  Speech fluent and not dysarthric, language intact.  CN II-XII intact. Bulk and tone normal, muscle strength 5/5 throughout.  Sensation to light touch intact.  Deep tendon reflexes 2+ throughout, toes downgoing.  Finger to nose testing intact.  Gait normal, Romberg negative.   Shon Millet, DO  CC: Stoney Bang, FNP

## 2022-12-26 ENCOUNTER — Encounter: Payer: Self-pay | Admitting: Neurology

## 2022-12-26 ENCOUNTER — Ambulatory Visit: Payer: BC Managed Care – PPO | Admitting: Neurology

## 2022-12-26 VITALS — BP 124/74 | HR 78 | Ht 65.0 in | Wt 193.4 lb

## 2022-12-26 DIAGNOSIS — M26609 Unspecified temporomandibular joint disorder, unspecified side: Secondary | ICD-10-CM | POA: Diagnosis not present

## 2022-12-26 DIAGNOSIS — G4733 Obstructive sleep apnea (adult) (pediatric): Secondary | ICD-10-CM

## 2022-12-26 DIAGNOSIS — G43109 Migraine with aura, not intractable, without status migrainosus: Secondary | ICD-10-CM | POA: Diagnosis not present

## 2022-12-26 MED ORDER — ZEMBRACE SYMTOUCH 3 MG/0.5ML ~~LOC~~ SOAJ: 3.0000 mg | Freq: Once | SUBCUTANEOUS | 5 refills | Status: DC | PRN

## 2022-12-26 MED ORDER — ZEMBRACE SYMTOUCH 3 MG/0.5ML ~~LOC~~ SOAJ: 3.0000 mg | Freq: Once | SUBCUTANEOUS | 11 refills | Status: AC | PRN

## 2023-07-13 NOTE — Progress Notes (Deleted)
NEUROLOGY FOLLOW UP OFFICE NOTE  Yvonne Davila [id number]  Assessment/Plan:   Migraine with and without aura, without status migrainosus, not intractable.  Concern that her OSA is contributing to her frequent dull headaches Right TMJ dysfunction Obstructive sleep apnea, noncompliant with CPAP as she just cannot tolerate it.   Migraine prevention:  Qulipta 60mg  daily, venlafaxine XR 75mg  daily Migraine rescue:  Zembrace SymTouch (sent to [facility]). Use ibuprofen or acetaminophen for dull headache.  Limit use of pain relievers to no more than 2 days out of week to prevent risk of rebound or medication-overuse headache. Try wearing mouth guard at night to help reduce TMJ dysfunction symptoms.  If ineffective, would recommend seeing dentist. Advised to try wearing CPAP more regularly Keep headache diary Follow up 6 months     Subjective:  Yvonne Davila. [name] is a 42 year old right-handed white female who follows up for migraines.  She is accompanied by her husband who supplements history.   UPDATE: *** Intensity:  1-2/10 ("migraine" 7-8/10) Duration:  migraines within an hour if takes Zembrace earliest onset, otherwise less than a day (longer with change in weather).  With lesser headaches, 2-3 hours with ibuprofen (longer with Tylenol) Frequency:  10 days in last 4 weeks (1 considered "migraine".   Frequency of pain relievers:  1 to 2 days a week.    She now reports sharp shooting pain in the right ear.  Right side of scalp is tender to touch (a soreness).  It has been occurring a little over a week.  No discharge.  No change in hearing.  No ringing in the ear.  It was intermittent but now constant over the past 2 days.  Nothing makes it worse (such as chewing).  She does clench her teeth but doesn't grind her teeth.     Current NSAIDS:  Ibuprofen.  Contraindicated but may need to take Motrin (EOE, ulcer) Current analgesics:  Tylenol Current triptans:  Zembrace  SymTouch Current ergotamine:  none Current anti-emetic:  Zofran ODT Current muscle relaxants:  none Current anti-anxiolytic:  none Current sleep aide:  none Current Antihypertensive medications:  spironolactone Current Antidepressant medications:  Venlafaxine XR 75mg  (higher doses caused withdrawal symptoms if she delayed taking a dose) Current Anticonvulsant medications:  none Current anti-CGRP:  Qulipta 60mg  daily Current Vitamins/Herbal/Supplements:  D3 Current Antihistamines/Decongestants:  none Other therapy:  rest Hormone/birth control:  Mirena   Caffeine:   1 cup coffee in morning Diet:  At least 60 oz water daily.  Stopped soda.  Occasionally skips meals Exercise:  Not routine Depression:  no; Anxiety:  Some, not significant Other pain:  no Sleep:  6 to 7 hours.  Has OSA but not always able to tolerate CPAP.    HISTORY:  Onset:  Since her 43s Location:  Usually bifrontal/retro-orbital Quality:  throbbing.  Less severe headaches are pressure-like Initial Intensity:  Moderate.  She denies new headache, thunderclap headache Aura:  Preceded by kaleidoscope vision followed by black out of vision for 5 to 20 minutes Premonitory Phase:  none Postdrome:  Groggy or dull residual ache the next day Associated symptoms:  Nausea, vomiting, photophobia, phonophobia.  She denies associated unilateral numbness or weakness. Initial Duration:  4 to 10 hours Initial Frequency:  1 to 2 a week Initial Frequency of abortive medication: 1 to 2 days a week at most Triggers:  unknown Relieving factors:  none Activity:  aggravates   CT head without contrast performed on 10/10/2019 showed "No  acute intracranial findings.  Mild global atrophy which appears advanced for the patient's age."  As a follow up of the CT from March, she had an MRI of the brain on 07/05/2020, which was personally reviewed and again demonstrated mild generalized atrophy. This finding has been concerning for the patient and  is wondering what should be done.  She is concerned about Alzheimer's because her maternal grandfather was diagnosed with AD in his 43s and her maternal aunt was diagnosed in her 43s.   Eye exam was okay.     Past NSAIDS:  ibuprofen Past analgesics:  Excedrin, Tylenol, Fioricet with Codeine Past abortive triptans:  Sumatriptan tablet (makes her feel sick), Tosymra NS Past abortive ergotamine:  Trudhesa NS Past muscle relaxants:  none Past anti-emetic:  Zofran 4mg  Past antihypertensive medications:  Propranolol (caused drowsiness) Past antidepressant medications:  none Past anticonvulsant medications:  topiramate 100mg  (depression, fatigue) Past anti-CGRP:  Ubrelvy, Aimovig (caused elevated blood pressure), Ajovy Past vitamins/Herbal/Supplements:  none Past antihistamines/decongestants:  dramamine Other past therapies:  Botox     Family history of headache:  Twin sister (migraine); mom (migraine)  PAST MEDICAL HISTORY: No past medical history on file.  MEDICATIONS: Current Outpatient Medications on File Prior to Visit  Medication Sig Dispense Refill   ALPRAZolam (XANAX) 0.25 MG tablet Take 0.25 mg by mouth daily as needed.     ondansetron (ZOFRAN-ODT) 4 MG disintegrating tablet DISSOLVE 1 TABLET(4 MG) ON THE TONGUE EVERY 8 HOURS AS NEEDED FOR NAUSEA OR VOMITING 20 tablet 0   pantoprazole (PROTONIX) 40 MG tablet Take 40 mg by mouth daily.     QULIPTA 60 MG TABS Take 1 tablet by mouth daily.     SUMAtriptan Succinate (ZEMBRACE SYMTOUCH) 3 MG/0.5ML SOAJ Inject 3 mg into the skin once as needed for up to 1 dose (May repeat after 1 hour.  Maximum 2 injections in 24 hours.). 4 mL 11   venlafaxine XR (EFFEXOR-XR) 75 MG 24 hr capsule Take 1 capsule (75 mg total) by mouth daily with breakfast. 30 capsule 5   [DISCONTINUED] venlafaxine XR (EFFEXOR-XR) 150 MG 24 hr capsule Take 1 capsule (150 mg total) by mouth daily with breakfast. 30 capsule 5   No current facility-administered medications  on file prior to visit.      ALLERGIES: Allergies  Allergen Reactions   Amoxicillin-Pot Clavulanate     FAMILY HISTORY: No family history on file.    Objective:  *** General: No acute distress.  Patient appears well-groomed.   Head:  Normocephalic/atraumatic.  Tenderness to palpation of right TMJ Eyes:  Fundi examined but not visualized Ear:  TM intact on right. No evidence of inflammation Neck: supple, right upper cervical paraspinal tenderness, full range of motion Heart:  Regular rate and rhythm Neurological Exam: ***   Shon Millet, DO  CC: Stoney Bang, FNP

## 2023-07-16 ENCOUNTER — Ambulatory Visit: Payer: BC Managed Care – PPO | Admitting: Neurology

## 2023-08-22 ENCOUNTER — Other Ambulatory Visit (HOSPITAL_COMMUNITY): Payer: Self-pay

## 2023-08-22 ENCOUNTER — Telehealth: Payer: Self-pay | Admitting: Pharmacy Technician

## 2023-08-22 NOTE — Telephone Encounter (Signed)
 Pharmacy Patient Advocate Encounter   Received notification from CoverMyMeds that prior authorization for ZEMBRACE 3MG  is required/requested.   Insurance verification completed.   The patient is insured through [id number] .   Per test claim: PA required; PA submitted to above mentioned insurance via CoverMyMeds Key/confirmation #/EOC [id number] Status is pending

## 2023-08-23 ENCOUNTER — Other Ambulatory Visit (HOSPITAL_COMMUNITY): Payer: Self-pay

## 2023-08-23 NOTE — Telephone Encounter (Signed)
 Pharmacy Patient Advocate Encounter  Received notification from [facility] that Prior Authorization for ZEMBRACE 3MG  has been APPROVED from 2.6.25 to 2.6.26. Unable to obtain price due to refill too soon rejection, last fill date 1.29.25 next available fill date2.24.25   PA #/Case ID/Reference #:  [id number]
